# Patient Record
Sex: Female | Born: 1985
Health system: Southern US, Community
[De-identification: ages and names within clinical notes are randomized; demographics above are authoritative.]

## PROBLEM LIST (undated history)

## (undated) DIAGNOSIS — N200 Calculus of kidney: Secondary | ICD-10-CM

## (undated) DIAGNOSIS — R87810 Cervical high risk human papillomavirus (HPV) DNA test positive: Secondary | ICD-10-CM

## (undated) DIAGNOSIS — R87629 Unspecified abnormal cytological findings in specimens from vagina: Secondary | ICD-10-CM

## (undated) DIAGNOSIS — T7840XA Allergy, unspecified, initial encounter: Secondary | ICD-10-CM

## (undated) DIAGNOSIS — K8020 Calculus of gallbladder without cholecystitis without obstruction: Secondary | ICD-10-CM

## (undated) DIAGNOSIS — F419 Anxiety disorder, unspecified: Secondary | ICD-10-CM

## (undated) HISTORY — DX: Allergy, unspecified, initial encounter: T78.40XA

## (undated) HISTORY — DX: Calculus of kidney: N20.0

## (undated) HISTORY — DX: Unspecified abnormal cytological findings in specimens from vagina: R87.629

## (undated) HISTORY — DX: Cervical high risk human papillomavirus (HPV) DNA test positive: R87.810

## (undated) HISTORY — DX: Calculus of gallbladder without cholecystitis without obstruction: K80.20

## (undated) HISTORY — DX: Anxiety disorder, unspecified: F41.9

## (undated) HISTORY — PX: WISDOM TOOTH EXTRACTION: SHX21

---

## 2013-05-17 ENCOUNTER — Emergency Department: Payer: Self-pay | Admitting: Emergency Medicine

## 2013-05-17 LAB — CBC WITH DIFFERENTIAL/PLATELET
Basophil #: 0 10*3/uL (ref 0.0–0.1)
Basophil %: 0.6 %
Eosinophil #: 0.1 10*3/uL (ref 0.0–0.7)
Eosinophil %: 1.8 %
HCT: 41.9 % (ref 35.0–47.0)
HGB: 14.9 g/dL (ref 12.0–16.0)
Lymphocyte #: 2.4 10*3/uL (ref 1.0–3.6)
Lymphocyte %: 46.9 %
MCH: 32.6 pg (ref 26.0–34.0)
MCHC: 35.5 g/dL (ref 32.0–36.0)
MCV: 92 fL (ref 80–100)
Monocyte #: 0.4 x10 3/mm (ref 0.2–0.9)
Monocyte %: 7.2 %
Neutrophil #: 2.2 10*3/uL (ref 1.4–6.5)
Neutrophil %: 43.5 %
Platelet: 215 10*3/uL (ref 150–440)
RBC: 4.56 10*6/uL (ref 3.80–5.20)
RDW: 12.9 % (ref 11.5–14.5)
WBC: 5 10*3/uL (ref 3.6–11.0)

## 2013-05-17 LAB — URINALYSIS, COMPLETE
Bacteria: NONE SEEN
Bilirubin,UR: NEGATIVE
Glucose,UR: NEGATIVE mg/dL (ref 0–75)
Nitrite: NEGATIVE
Ph: 7 (ref 4.5–8.0)
Protein: 30
RBC,UR: 391 /HPF (ref 0–5)
Specific Gravity: 1.023 (ref 1.003–1.030)
Squamous Epithelial: 22
WBC UR: 33 /HPF (ref 0–5)

## 2013-05-17 LAB — BASIC METABOLIC PANEL
Anion Gap: 4 — ABNORMAL LOW (ref 7–16)
BUN: 9 mg/dL (ref 7–18)
Calcium, Total: 9.2 mg/dL (ref 8.5–10.1)
Chloride: 107 mmol/L (ref 98–107)
Co2: 27 mmol/L (ref 21–32)
Creatinine: 0.8 mg/dL (ref 0.60–1.30)
EGFR (African American): >60
EGFR (Non-African Amer.): >60
Glucose: 120 mg/dL — ABNORMAL HIGH (ref 65–99)
Osmolality: 276 (ref 275–301)
Potassium: 3.8 mmol/L (ref 3.5–5.1)
Sodium: 138 mmol/L (ref 136–145)

## 2013-05-19 ENCOUNTER — Ambulatory Visit: Payer: Self-pay | Admitting: Urology

## 2013-05-19 LAB — URINE CULTURE

## 2013-06-18 ENCOUNTER — Ambulatory Visit: Payer: Self-pay | Admitting: Urology

## 2015-12-27 LAB — HM PAP SMEAR: HM Pap smear: NEGATIVE

## 2017-01-07 ENCOUNTER — Encounter: Payer: Self-pay | Admitting: Obstetrics and Gynecology

## 2017-01-07 ENCOUNTER — Ambulatory Visit (INDEPENDENT_AMBULATORY_CARE_PROVIDER_SITE_OTHER): Payer: Self-pay | Admitting: Obstetrics and Gynecology

## 2017-01-07 VITALS — BP 98/60 | HR 61 | Ht 65.0 in | Wt 143.0 lb

## 2017-01-07 DIAGNOSIS — Z01419 Encounter for gynecological examination (general) (routine) without abnormal findings: Secondary | ICD-10-CM

## 2017-01-07 DIAGNOSIS — Z124 Encounter for screening for malignant neoplasm of cervix: Secondary | ICD-10-CM

## 2017-01-07 DIAGNOSIS — Z3042 Encounter for surveillance of injectable contraceptive: Secondary | ICD-10-CM

## 2017-01-07 MED ORDER — MEDROXYPROGESTERONE ACETATE 150 MG/ML IM SUSY: 150.0000 mg | PREFILLED_SYRINGE | INTRAMUSCULAR | 3 refills | Status: DC

## 2017-01-07 NOTE — Progress Notes (Signed)
HPI:      Kayla Townsend is a 31 y.o. No obstetric history on file. who LMP was No LMP recorded (lmp unknown). Patient has had an injection., presents today for her annual examination.  Her menses are absent due to depo.  Dysmenorrhea none. She does not have intermenstrual bleeding.  Sex activity: not sexually active.  Last Pap: December 27, 2015  Results were: no abnormalities  Hx of STDs: none   There is no FH of breast cancer. There is no FH of ovarian cancer. The patient does do self-breast exams.  Tobacco use: The patient denies current or previous tobacco use. Alcohol use: none Exercise: very active  She does get adequate calcium and Vitamin D in her diet.    Past Medical History:  Diagnosis Date  . Kidney stone     History reviewed. No pertinent surgical history.  Family History  Problem Relation Age of Onset  . Diabetes Maternal Grandfather   . Lung cancer Paternal Grandfather      ROS:  Review of Systems  Constitutional: Negative for fever, malaise/fatigue and weight loss.  HENT: Negative for congestion, ear pain and sinus pain.   Respiratory: Negative for cough, shortness of breath and wheezing.   Cardiovascular: Negative for chest pain, orthopnea and leg swelling.  Gastrointestinal: Negative for constipation, diarrhea, nausea and vomiting.  Genitourinary: Negative for dysuria, frequency, hematuria and urgency.       Breast ROS: negative   Musculoskeletal: Negative for back pain, joint pain and myalgias.  Skin: Negative for itching and rash.  Neurological: Negative for dizziness, tingling, focal weakness and headaches.  Endo/Heme/Allergies: Negative for environmental allergies. Does not bruise/bleed easily.  Psychiatric/Behavioral: Negative for depression and suicidal ideas. The patient is not nervous/anxious and does not have insomnia.     Objective: BP 98/60 (BP Location: Left Arm, Patient Position: Sitting, Cuff Size: Normal)   Pulse 61   Ht   (1.651 m)   Wt 143 lb (64.9 kg)   LMP  (LMP Unknown)   BMI 23.80 kg/m    Physical Exam  Constitutional: She is oriented to person, place, and time. She appears well-developed and well-nourished.  Genitourinary: Vagina normal and uterus normal. No erythema or tenderness in the vagina. No vaginal discharge found. Right adnexum does not display mass and does not display tenderness. Left adnexum does not display mass and does not display tenderness. Cervix does not exhibit motion tenderness or polyp. Uterus is not enlarged or tender.  Neck: Normal range of motion. No thyromegaly present.  Cardiovascular: Normal rate, regular rhythm and normal heart sounds.   No murmur heard. Pulmonary/Chest: Effort normal and breath sounds normal. Right breast exhibits no mass, no nipple discharge, no skin change and no tenderness. Left breast exhibits no mass, no nipple discharge, no skin change and no tenderness.  Abdominal: Soft. There is no tenderness. There is no guarding.  Musculoskeletal: Normal range of motion.  Neurological: She is alert and oriented to person, place, and time. No cranial nerve deficit.  Psychiatric: She has a normal mood and affect. Her behavior is normal.  Vitals reviewed.    Assessment/Plan: Encounter for annual routine gynecological examination  Cervical cancer screening - Plan: IGP, rfx Aptima HPV ASCU  Encounter for surveillance of injectable contraceptive - Doing well with depo. Cont calcium. May need to get 8/18 injection early since going to [place] for 3 months. - Plan: MedroxyPROGESTERone Acetate 150 MG/ML SUSY  GYN counsel use and side effects of depo, adequate intake of calcium and vitamin D     F/U  Return in about 1 year (around 01/07/2018).  Shaheed Schmuck B. Corabelle Spackman, PA-C 01/07/2017 2:37 PM

## 2017-01-08 LAB — IGP, RFX APTIMA HPV ASCU: PAP Smear Comment: 0

## 2017-01-24 ENCOUNTER — Ambulatory Visit: Payer: Self-pay

## 2017-01-31 ENCOUNTER — Ambulatory Visit: Payer: Self-pay

## 2017-01-31 ENCOUNTER — Ambulatory Visit (INDEPENDENT_AMBULATORY_CARE_PROVIDER_SITE_OTHER): Payer: Self-pay

## 2017-01-31 DIAGNOSIS — Z308 Encounter for other contraceptive management: Secondary | ICD-10-CM

## 2017-01-31 MED ORDER — MEDROXYPROGESTERONE ACETATE 150 MG/ML IM SUSP
150.0000 mg | Freq: Once | INTRAMUSCULAR | Status: AC
  Administered 2017-01-31: 150 mg via INTRAMUSCULAR

## 2017-01-31 NOTE — Progress Notes (Signed)
Pt here for depo which was given IM right glut.  NDC# [address]-2 

## 2017-05-03 ENCOUNTER — Ambulatory Visit: Payer: Self-pay

## 2017-05-03 ENCOUNTER — Ambulatory Visit (INDEPENDENT_AMBULATORY_CARE_PROVIDER_SITE_OTHER): Payer: Self-pay

## 2017-05-03 DIAGNOSIS — Z3042 Encounter for surveillance of injectable contraceptive: Secondary | ICD-10-CM

## 2017-05-03 MED ORDER — MEDROXYPROGESTERONE ACETATE 150 MG/ML IM SUSP
150.0000 mg | Freq: Once | INTRAMUSCULAR | Status: AC
  Administered 2017-05-03: 150 mg via INTRAMUSCULAR

## 2017-07-25 ENCOUNTER — Telehealth: Payer: Self-pay

## 2017-07-25 NOTE — Telephone Encounter (Signed)
PT called triage line stating she would like to discuss switching birthcontrol. CB# [phone]  LVM for patient to call me back so I could get more information to send to the provider.

## 2017-07-26 ENCOUNTER — Ambulatory Visit: Payer: Self-pay

## 2017-07-29 ENCOUNTER — Ambulatory Visit (INDEPENDENT_AMBULATORY_CARE_PROVIDER_SITE_OTHER): Payer: Self-pay | Admitting: Obstetrics and Gynecology

## 2017-07-29 ENCOUNTER — Encounter: Payer: Self-pay | Admitting: Obstetrics and Gynecology

## 2017-07-29 VITALS — BP 100/70 | Ht 65.0 in | Wt 149.0 lb

## 2017-07-29 DIAGNOSIS — Z30011 Encounter for initial prescription of contraceptive pills: Secondary | ICD-10-CM

## 2017-07-29 MED ORDER — NORGESTIMATE-ETH ESTRADIOL 0.25-35 MG-MCG PO TABS: 1.0000 | ORAL_TABLET | Freq: Every day | ORAL | 6 refills | Status: DC

## 2017-07-29 NOTE — Progress Notes (Signed)
   Chief Complaint  Patient presents with  . Contraception    HPI:      Kayla Townsend is a 31 y.o. G0P0000 who LMP was No LMP recorded. Patient has had an injection., presents today for ScnetxBC change. Pt has been on depo and wants to come off. She tried on her own but had irregular bleeding for a month and restarted depo. She would like to transition to OCPs first to see if will help with cycle control. Never been on them before. No hx of migraines with aura. Next depo due by mid Nov. She is not sex active.    Past Medical History:  Diagnosis Date  . Kidney stone     History reviewed. No pertinent surgical history.  Family History  Problem Relation Age of Onset  . Diabetes Maternal Grandfather   . Lung cancer Paternal Grandfather     Social History   Social History  . Marital status: Single    Spouse name: N/A  . Number of children: N/A  . Years of education: N/A   Occupational History  . Not on file.   Social History Main Topics  . Smoking status: Never Smoker  . Smokeless tobacco: Never Used  . Alcohol use No  . Drug use: No  . Sexual activity: Not Currently    Birth control/ protection: Injection   Other Topics Concern  . Not on file   Social History Narrative  . No narrative on file     Current Outpatient Prescriptions:  .  doxycycline (VIBRAMYCIN) 100 MG capsule, TK 1 C PO BID WF, Disp: , Rfl: 2 .  norgestimate-ethinyl estradiol (ORTHO-CYCLEN,SPRINTEC,PREVIFEM) 0.25-35 MG-MCG tablet, Take 1 tablet by mouth daily., Disp: 28 tablet, Rfl: 6   ROS:  Review of Systems  Constitutional: Negative for fever.  Gastrointestinal: Negative for blood in stool, constipation, diarrhea, nausea and vomiting.  Genitourinary: Negative for dyspareunia, dysuria, flank pain, frequency, hematuria, urgency, vaginal bleeding, vaginal discharge and vaginal pain.  Musculoskeletal: Negative for back pain.  Skin: Negative for rash.     OBJECTIVE:   Vitals:  BP 100/70    Ht 5\' 5"  (1.651 m)   Wt 149 lb (67.6 kg)   BMI 24.79 kg/m   Physical Exam  Constitutional: She is oriented to person, place, and time and well-developed, well-nourished, and in no distress.  Neurological: She is alert and oriented to person, place, and time.  Psychiatric: Memory, affect and judgment normal.  Vitals reviewed.   Assessment/Plan: Encounter for initial prescription of contraceptive pills - OCP start on Sun. Condoms for 1 mo prn. Rx sprintec since self-pay.  - Plan: norgestimate-ethinyl estradiol (ORTHO-CYCLEN,SPRINTEC,PREVIFEM) 0.25-35 MG-MCG tablet, DISCONTINUED: norgestimate-ethinyl estradiol (ORTHO-CYCLEN,SPRINTEC,PREVIFEM) 0.25-35 MG-MCG tablet    Return if symptoms worsen or fail to improve.  Alicia B. Copland, PA-C 07/29/2017 3:51 PM

## 2017-10-22 ENCOUNTER — Emergency Department: Payer: Self-pay

## 2017-10-22 ENCOUNTER — Emergency Department
Admission: EM | Admit: 2017-10-22 | Discharge: 2017-10-22 | Disposition: A | Discharge status: Home / Self Care | Payer: Self-pay | Attending: Emergency Medicine | Admitting: Emergency Medicine

## 2017-10-22 ENCOUNTER — Other Ambulatory Visit: Payer: Self-pay

## 2017-10-22 ENCOUNTER — Encounter: Payer: Self-pay | Admitting: Emergency Medicine

## 2017-10-22 DIAGNOSIS — R3129 Other microscopic hematuria: Secondary | ICD-10-CM | POA: Insufficient documentation

## 2017-10-22 DIAGNOSIS — R109 Unspecified abdominal pain: Secondary | ICD-10-CM

## 2017-10-22 DIAGNOSIS — N23 Unspecified renal colic: Secondary | ICD-10-CM | POA: Insufficient documentation

## 2017-10-22 DIAGNOSIS — Z87442 Personal history of urinary calculi: Secondary | ICD-10-CM | POA: Insufficient documentation

## 2017-10-22 LAB — CBC WITH DIFFERENTIAL/PLATELET
Basophils Absolute: 0 10*3/uL (ref 0–0.1)
Basophils Relative: 1 %
Eosinophils Absolute: 0.1 10*3/uL (ref 0–0.7)
Eosinophils Relative: 1 %
HCT: 42.1 % (ref 35.0–47.0)
Hemoglobin: 14.8 g/dL (ref 12.0–16.0)
Lymphocytes Relative: 20 %
Lymphs Abs: 1.3 10*3/uL (ref 1.0–3.6)
MCH: 32.7 pg (ref 26.0–34.0)
MCHC: 35.1 g/dL (ref 32.0–36.0)
MCV: 92.9 fL (ref 80.0–100.0)
Monocytes Absolute: 0.5 10*3/uL (ref 0.2–0.9)
Monocytes Relative: 7 %
Neutro Abs: 4.8 10*3/uL (ref 1.4–6.5)
Neutrophils Relative %: 71 %
Platelets: 214 10*3/uL (ref 150–440)
RBC: 4.53 MIL/uL (ref 3.80–5.20)
RDW: 13.7 % (ref 11.5–14.5)
WBC: 6.7 10*3/uL (ref 3.6–11.0)

## 2017-10-22 LAB — URINALYSIS, COMPLETE (UACMP) WITH MICROSCOPIC
Bacteria, UA: NONE SEEN
Bilirubin Urine: NEGATIVE
Glucose, UA: NEGATIVE mg/dL
Ketones, ur: 20 mg/dL — AB
Leukocytes, UA: NEGATIVE
Nitrite: NEGATIVE
Protein, ur: NEGATIVE mg/dL
Specific Gravity, Urine: 1.017 (ref 1.005–1.030)
pH: 7 (ref 5.0–8.0)

## 2017-10-22 LAB — BASIC METABOLIC PANEL
Anion gap: 11 (ref 5–15)
BUN: 16 mg/dL (ref 6–20)
CO2: 22 mmol/L (ref 22–32)
Calcium: 8.9 mg/dL (ref 8.9–10.3)
Chloride: 105 mmol/L (ref 101–111)
Creatinine, Ser: 0.93 mg/dL (ref 0.44–1.00)
GFR calc Af Amer: >60 mL/min (ref >60)
GFR calc non Af Amer: >60 mL/min (ref >60)
Glucose, Bld: 144 mg/dL — ABNORMAL HIGH (ref 65–99)
Potassium: 3.8 mmol/L (ref 3.5–5.1)
Sodium: 138 mmol/L (ref 135–145)

## 2017-10-22 LAB — HCG, QUANTITATIVE, PREGNANCY: hCG, Beta Chain, Quant, S: <1 m[IU]/mL (ref <5)

## 2017-10-22 MED ORDER — MORPHINE SULFATE (PF) 4 MG/ML IV SOLN
INTRAVENOUS | Status: AC
  Administered 2017-10-22: 4 mg via INTRAVENOUS
  Filled 2017-10-22: qty 1

## 2017-10-22 MED ORDER — ONDANSETRON HCL 4 MG/2ML IJ SOLN
4.0000 mg | Freq: Once | INTRAMUSCULAR | Status: AC
  Administered 2017-10-22: 4 mg via INTRAVENOUS

## 2017-10-22 MED ORDER — OXYCODONE-ACETAMINOPHEN 5-325 MG PO TABS
ORAL_TABLET | ORAL | Status: AC
  Administered 2017-10-22: 1 via ORAL
  Filled 2017-10-22: qty 1

## 2017-10-22 MED ORDER — NAPROXEN 500 MG PO TABS: 500.0000 mg | ORAL_TABLET | Freq: Two times a day (BID) | ORAL | 0 refills | Status: DC

## 2017-10-22 MED ORDER — ONDANSETRON 4 MG PO TBDP: 4.0000 mg | ORAL_TABLET | Freq: Three times a day (TID) | ORAL | 0 refills | Status: DC | PRN

## 2017-10-22 MED ORDER — OXYCODONE-ACETAMINOPHEN 5-325 MG PO TABS
1.0000 | ORAL_TABLET | Freq: Once | ORAL | Status: AC
  Administered 2017-10-22: 1 via ORAL

## 2017-10-22 MED ORDER — KETOROLAC TROMETHAMINE 60 MG/2ML IM SOLN
15.0000 mg | Freq: Once | INTRAMUSCULAR | Status: AC
  Administered 2017-10-22: 15 mg via INTRAMUSCULAR
  Filled 2017-10-22: qty 2

## 2017-10-22 MED ORDER — MORPHINE SULFATE (PF) 4 MG/ML IV SOLN
4.0000 mg | Freq: Once | INTRAVENOUS | Status: AC
  Administered 2017-10-22: 4 mg via INTRAVENOUS

## 2017-10-22 MED ORDER — ONDANSETRON HCL 4 MG/2ML IJ SOLN
INTRAMUSCULAR | Status: AC
  Administered 2017-10-22: 4 mg via INTRAVENOUS
  Filled 2017-10-22: qty 2

## 2017-10-22 MED ORDER — SODIUM CHLORIDE 0.9 % IV BOLUS (SEPSIS)
1000.0000 mL | Freq: Once | INTRAVENOUS | Status: AC
  Administered 2017-10-22: 1000 mL via INTRAVENOUS

## 2017-10-22 MED ORDER — OXYCODONE HCL 5 MG PO TABS: 5.0000 mg | ORAL_TABLET | Freq: Four times a day (QID) | ORAL | 0 refills | Status: DC | PRN

## 2017-10-22 NOTE — ED Notes (Signed)
First Nurse Note:  Complaining of right flank pain and nausea.  Skin damp to touch.  Seated in WC.

## 2017-10-22 NOTE — ED Provider Notes (Signed)
[facility] Emergency Department Provider Note  ____________________________________________  Time seen: Approximately 11:43 AM  I have reviewed the triage vital signs and the nursing notes.   HISTORY  Chief Complaint Flank Pain    HPI Kayla Townsend is a 32 y.o. female who complains of sudden onset right flank pain radiating around to the right lower quadrant starting at 8 AM today. Feels like prior kidney stones. It is constant, waxing and waning, severe, no aggravating or alleviating factors. Associated with nausea and vomiting. No fevers chills or sweats.     Past Medical History:  Diagnosis Date  . Kidney stone      There are no active problems to display for this patient.    History reviewed. No pertinent surgical history.   Prior to Admission medications   Medication Sig Start Date End Date Taking? Authorizing Provider  norgestimate-ethinyl estradiol (ORTHO-CYCLEN,SPRINTEC,PREVIFEM) 0.25-35 MG-MCG tablet Take 1 tablet by mouth daily. 07/29/17  Yes Copland, Helmut MusterAlicia B, PA-C  naproxen (NAPROSYN) 500 MG tablet Take 1 tablet (500 mg total) by mouth 2 (two) times daily with a meal. 10/22/17   Sharman CheekStafford, Adreana Coull, MD  ondansetron (ZOFRAN ODT) 4 MG disintegrating tablet Take 1 tablet (4 mg total) by mouth every 8 (eight) hours as needed for nausea or vomiting. 10/22/17   Sharman CheekStafford, Nixon Sparr, MD  oxyCODONE (ROXICODONE) 5 MG immediate release tablet Take 1 tablet (5 mg total) by mouth every 6 (six) hours as needed for breakthrough pain. 10/22/17   Sharman CheekStafford, Janai Maudlin, MD     Allergies Ceclor [cefaclor]   Family History  Problem Relation Age of Onset  . Diabetes Maternal Grandfather   . Lung cancer Paternal Grandfather     Social History Social History   Tobacco Use  . Smoking status: Never Smoker  . Smokeless tobacco: Never Used  Substance Use Topics  . Alcohol use: No  . Drug use: No    Review of Systems  Constitutional:   No fever or  chills.  Cardiovascular:   No chest pain or syncope. Respiratory:   No dyspnea or cough. Gastrointestinal:  Positive right flank pain and vomiting. No constipation Musculoskeletal:   Negative for focal pain or swelling All other systems reviewed and are negative except as documented above in ROS and HPI.  ____________________________________________   PHYSICAL EXAM:  VITAL SIGNS: ED Triage Vitals  Enc Vitals Group     BP 10/22/17 0905 (!) 93/59     Pulse Rate 10/22/17 0905 (!) 50     Resp 10/22/17 0905 (!) 25     Temp 10/22/17 0905 98.6 F (37 C)     Temp Source 10/22/17 0905 Oral     SpO2 10/22/17 0905 99 %     Weight 10/22/17 0910 149 lb (67.6 kg)     Height --      Head Circumference --      Peak Flow --      Pain Score 10/22/17 0910 10     Pain Loc --      Pain Edu? --      Excl. in GC? --     Vital signs reviewed, nursing assessments reviewed.   Constitutional:   Alert and oriented. Very uncomfortable but not in distress  ENT   Head:   Normocephalic and atraumatic.        Neck:   No meningismus. Full ROM. Hematological/Lymphatic/Immunilogical:   No cervical lymphadenopathy. Cardiovascular:   RRR. Symmetric bilateral radial and DP pulses.  No murmurs.  Respiratory:   Normal respiratory effort without tachypnea/retractions. Breath sounds are clear and equal bilaterally. No wheezes/rales/rhonchi. Gastrointestinal:   Soft and nontender. Non distended. There is mild right CVA tenderness.  No rebound, rigidity, or guarding. Genitourinary:   deferred Musculoskeletal:   Normal range of motion in all extremities. No joint effusions.  No lower extremity tenderness.  No edema. Neurologic:   Normal speech and language.  Motor grossly intact. No acute focal neurologic deficits are appreciated.    ____________________________________________    LABS (pertinent positives/negatives) (all labs ordered are listed, but only abnormal results are displayed) Labs  Reviewed  BASIC METABOLIC PANEL - Abnormal; Notable for the following components:      Result Value   Glucose, Bld 144 (*)    All other components within normal limits  URINALYSIS, COMPLETE (UACMP) WITH MICROSCOPIC - Abnormal; Notable for the following components:   Color, Urine YELLOW (*)    APPearance HAZY (*)    Hgb urine dipstick SMALL (*)    Ketones, ur 20 (*)    Squamous Epithelial / LPF 0-5 (*)    All other components within normal limits  CBC WITH DIFFERENTIAL/PLATELET  HCG, QUANTITATIVE, PREGNANCY   ____________________________________________   EKG    ____________________________________________    RADIOLOGY  Ct Renal Stone Study  Result Date: 10/22/2017 CLINICAL DATA:  Right flank pain radiating to the abdomen, history of kidney stones, nausea and vomiting EXAM: CT ABDOMEN AND PELVIS WITHOUT CONTRAST TECHNIQUE: Multidetector CT imaging of the abdomen and pelvis was performed following the standard protocol without IV contrast. COMPARISON:  KUB of 06/18/2013 and CT abdomen pelvis of 05/17/2013 FINDINGS: Lower chest: The lung bases are clear. The heart is within normal limits in size. No pericardial effusion is seen. Hepatobiliary: The liver is unremarkable in the unenhanced state. No calcified gallstones are seen. Pancreas: The pancreas is not well seen on this unenhanced study but no significant abnormality is evident. Spleen: The spleen is stable in size without focal abnormality. Adrenals/Urinary Tract: The adrenal glands appear normal. There are multiple small renal calculi bilaterally. No left hydronephrosis is seen. A probable tiny cyst emanates from the lower pole of the left kidney. However there is mild right hydronephrosis and minimal dilatation of the of the right ureter two point of low-grade obstruction by a 3 mm distal right ureteral calculus, within 1 or 2 cm of the right UV junction. The left ureter is normal in caliber. The urinary bladder is not well distended  but no abnormality is noted. Stomach/Bowel: The stomach is decompressed and cannot be well evaluated. No small bowel distention is seen. There is a moderate amount of feces throughout the entire colon. No bowel obstruction is noted. The terminal ileum and the appendix are unremarkable. Vascular/Lymphatic: The abdominal aorta is normal in caliber. No adenopathy is seen. Reproductive: The uterus is normal in size. No adnexal lesion is seen. No fluid is noted within the pelvis. Other: None. Musculoskeletal: The lumbar vertebrae are in normal alignment with normal intervertebral disc spaces. The SI joints appear well corticated. IMPRESSION: 1. Low-grade obstruction of the right renal collecting system and ureter by a 3 mm distal left ureteral calculus very near the right UV junction. 2. Multiple small bilateral renal calculi. Electronically Signed   By: Dwyane Dee M.D.   On: 10/22/2017 11:27    ____________________________________________   PROCEDURES Procedures  ____________________________________________    CLINICAL IMPRESSION / ASSESSMENT AND PLAN / ED COURSE  Pertinent labs & imaging results that were available  during my care of the patient were reviewed by me and considered in my medical decision making (see chart for details).     Clinical Course as of Oct 22 1141  Tue Oct 22, 2017  6045 Patient in severe pain, likely renal colic. We'll get a CT scan all checking labs urinalysis. Patient received Toradol and Zofran. With pain control is not adequate within a few minutes we will give opioids as well. IV fluids.  [PS]  1025 Pain relief inadequate with toradol. Morphine given.   [PS]  1133 Ct reveals 3mm stone near R UVJ. Highly likely to pass with substantial reduction in symptoms soon. Will give po percocet, plan for outpatient f/u.  CT Renal Soundra Pilon [PS]    Clinical Course User Index [PS] Sharman Cheek, MD   Considering the patient's symptoms, medical history, and physical  examination today, I have low suspicion for cholecystitis or biliary pathology, pancreatitis, perforation or bowel obstruction, hernia, intra-abdominal abscess, AAA or dissection, volvulus or intussusception, mesenteric ischemia, or appendicitis.     ____________________________________________   FINAL CLINICAL IMPRESSION(S) / ED DIAGNOSES    Final diagnoses:  Right flank pain  Ureteral colic  Other microscopic hematuria       Portions of this note were generated with dragon dictation software. Dictation errors may occur despite best attempts at proofreading.    Sharman Cheek, MD 10/22/17 1146

## 2017-10-22 NOTE — Discharge Instructions (Signed)
Your CT scan shows a 3mm stone near the bladder on the right.  It should pass into the bladder soon, at which point your symptoms will dramatically improve.

## 2017-10-22 NOTE — ED Triage Notes (Signed)
Pt to ed with c/o right flank pain radiates to abd, +n/v. Hx of kidney stones.

## 2017-10-24 ENCOUNTER — Other Ambulatory Visit: Payer: Self-pay | Admitting: Obstetrics & Gynecology

## 2017-10-24 ENCOUNTER — Telehealth: Payer: Self-pay

## 2017-10-24 NOTE — Telephone Encounter (Signed)
Pt went to walk in clinic but they would not give rx - they don't give pain med.  Adv her to f/u c [facility].  Maudry MayhewAdv Sandy that [facility] doesn't do primary care.  Pt needs PCP which she doesn't have nor does she have ins.  Adv may need to go back to ED which mom is trying to avoid d/t cost.  Mom is asking if you would be willing to give pain med.  Adv I would let you know but I couldn't make promises.

## 2017-10-24 NOTE — Telephone Encounter (Signed)
Pt's mom, Andrey CampanileSandy, calling for pt.  Pt has kidney stones, was seen in ED, given pain med - 6 pills, had reoccurance last night.  She was instructed to f/u c walkin clinic today.  Should she do that, see you, or can you call in more pain meds.  Evidently the stone hasn't passed and pt is anxious b/c she took last pain pill this am.  [phone]

## 2017-10-24 NOTE — Telephone Encounter (Signed)
Yes, she should see walk in clinic for mgmt. Thx.

## 2017-10-24 NOTE — Telephone Encounter (Signed)
Her pain meds have to have printed and signed Rx. I'm not in office till Mon. Ask RPH his thoughts since he is there today. I agree with whatever he says.

## 2017-10-24 NOTE — Telephone Encounter (Signed)
We havent seen this patient for this condition so not able to legally prescribe; would need to be seen

## 2017-10-28 NOTE — Telephone Encounter (Signed)
Left detailed msg.

## 2018-01-19 ENCOUNTER — Other Ambulatory Visit: Payer: Self-pay | Admitting: Obstetrics and Gynecology

## 2018-01-19 DIAGNOSIS — Z30011 Encounter for initial prescription of contraceptive pills: Secondary | ICD-10-CM

## 2018-01-31 ENCOUNTER — Telehealth: Payer: Self-pay

## 2018-01-31 NOTE — Telephone Encounter (Signed)
Spoke w/pt. Discussed that headaches can be a side effect of OCP's in some cases. Notified [facility] out of the office today & will return on Monday. [facility] can discuss with her other possible options. Pt not due to start new pack for 2 wks. She just wanted to discuss prior to picking up next pack.

## 2018-01-31 NOTE — Telephone Encounter (Signed)
Pt contacted after hours on call service this a.m. To report she began new birth control late October and has been getting headaches a couple of times per week since then. Inquiring if this is a side effect. Pt sees [facility]. Cb#[phone]

## 2018-02-04 NOTE — Telephone Encounter (Signed)
LMTRC

## 2018-02-05 NOTE — Telephone Encounter (Signed)
Pt called triage today and wants to discuss switching her birth control. Requests call from [facility]. Cb# [phone] thank you

## 2018-02-06 NOTE — Telephone Encounter (Signed)
LM. Can try different OCP vs different BC option. Also due for annual. Pt to call me back with pref.

## 2018-02-07 ENCOUNTER — Other Ambulatory Visit: Payer: Self-pay | Admitting: Obstetrics and Gynecology

## 2018-02-07 MED ORDER — LEVONORGESTREL-ETHINYL ESTRAD 0.1-20 MG-MCG PO TABS: 1.0000 | ORAL_TABLET | Freq: Every day | ORAL | 1 refills | Status: DC

## 2018-02-07 NOTE — Progress Notes (Signed)
Rx change due to HAs on sprintec. Annual due

## 2018-02-07 NOTE — Telephone Encounter (Signed)
Pt aware RX at pharm. Please schedule annual appt for pt. She is aware you will call her

## 2018-02-07 NOTE — Telephone Encounter (Signed)
Pt called after hours triage line. States she wants a different OCP, the cheapest one.

## 2018-02-07 NOTE — Telephone Encounter (Signed)
Patient is schedule 03/13/18 with [facility]

## 2018-02-07 NOTE — Telephone Encounter (Signed)
RN to let pt know different OCP eRxd to [facility]. Pt needs to start when due to start new pill pack. She was on cheapest without insurance. She will have to check with pharmacy about prices since self-pay. Pt needs to sched annual, too. Thx.

## 2018-03-03 DIAGNOSIS — L700 Acne vulgaris: Secondary | ICD-10-CM | POA: Diagnosis not present

## 2018-03-13 ENCOUNTER — Ambulatory Visit: Payer: Self-pay | Admitting: Obstetrics and Gynecology

## 2018-03-31 DIAGNOSIS — K8020 Calculus of gallbladder without cholecystitis without obstruction: Secondary | ICD-10-CM

## 2018-03-31 DIAGNOSIS — R87810 Cervical high risk human papillomavirus (HPV) DNA test positive: Secondary | ICD-10-CM

## 2018-03-31 HISTORY — DX: Cervical high risk human papillomavirus (HPV) DNA test positive: R87.810

## 2018-03-31 HISTORY — DX: Calculus of gallbladder without cholecystitis without obstruction: K80.20

## 2018-04-09 ENCOUNTER — Encounter: Payer: Self-pay | Admitting: Obstetrics and Gynecology

## 2018-04-09 ENCOUNTER — Other Ambulatory Visit: Payer: Self-pay

## 2018-04-09 ENCOUNTER — Emergency Department: Payer: BLUE CROSS/BLUE SHIELD

## 2018-04-09 ENCOUNTER — Encounter: Payer: Self-pay | Admitting: Emergency Medicine

## 2018-04-09 ENCOUNTER — Other Ambulatory Visit (HOSPITAL_COMMUNITY)
Admission: RE | Admit: 2018-04-09 | Discharge: 2018-04-09 | Disposition: A | Discharge status: Home / Self Care | Payer: BLUE CROSS/BLUE SHIELD | Source: Ambulatory Visit | Attending: Obstetrics and Gynecology | Admitting: Obstetrics and Gynecology

## 2018-04-09 ENCOUNTER — Ambulatory Visit (INDEPENDENT_AMBULATORY_CARE_PROVIDER_SITE_OTHER): Payer: BLUE CROSS/BLUE SHIELD | Admitting: Obstetrics and Gynecology

## 2018-04-09 ENCOUNTER — Emergency Department
Admission: EM | Admit: 2018-04-09 | Discharge: 2018-04-09 | Disposition: A | Discharge status: Home / Self Care | Payer: BLUE CROSS/BLUE SHIELD | Attending: Student in an Organized Health Care Education/Training Program | Admitting: Student in an Organized Health Care Education/Training Program

## 2018-04-09 VITALS — BP 112/68 | HR 81 | Ht 65.0 in | Wt 154.0 lb

## 2018-04-09 DIAGNOSIS — N132 Hydronephrosis with renal and ureteral calculous obstruction: Secondary | ICD-10-CM | POA: Diagnosis not present

## 2018-04-09 DIAGNOSIS — Z124 Encounter for screening for malignant neoplasm of cervix: Secondary | ICD-10-CM | POA: Diagnosis not present

## 2018-04-09 DIAGNOSIS — Z01411 Encounter for gynecological examination (general) (routine) with abnormal findings: Secondary | ICD-10-CM | POA: Diagnosis not present

## 2018-04-09 DIAGNOSIS — Z3041 Encounter for surveillance of contraceptive pills: Secondary | ICD-10-CM | POA: Diagnosis not present

## 2018-04-09 DIAGNOSIS — Z79899 Other long term (current) drug therapy: Secondary | ICD-10-CM | POA: Diagnosis not present

## 2018-04-09 DIAGNOSIS — N200 Calculus of kidney: Secondary | ICD-10-CM | POA: Diagnosis not present

## 2018-04-09 DIAGNOSIS — R109 Unspecified abdominal pain: Secondary | ICD-10-CM | POA: Diagnosis not present

## 2018-04-09 DIAGNOSIS — R51 Headache: Secondary | ICD-10-CM

## 2018-04-09 DIAGNOSIS — Z1151 Encounter for screening for human papillomavirus (HPV): Secondary | ICD-10-CM

## 2018-04-09 DIAGNOSIS — R1031 Right lower quadrant pain: Secondary | ICD-10-CM | POA: Diagnosis not present

## 2018-04-09 DIAGNOSIS — M545 Low back pain: Secondary | ICD-10-CM | POA: Diagnosis not present

## 2018-04-09 DIAGNOSIS — Z01419 Encounter for gynecological examination (general) (routine) without abnormal findings: Secondary | ICD-10-CM

## 2018-04-09 LAB — CBC WITH DIFFERENTIAL/PLATELET
Basophils Absolute: 0 10*3/uL (ref 0–0.1)
Basophils Relative: 0 %
Eosinophils Absolute: 0 10*3/uL (ref 0–0.7)
Eosinophils Relative: 0 %
HCT: 42.3 % (ref 35.0–47.0)
Hemoglobin: 14.9 g/dL (ref 12.0–16.0)
Lymphocytes Relative: 23 %
Lymphs Abs: 1.2 10*3/uL (ref 1.0–3.6)
MCH: 32.5 pg (ref 26.0–34.0)
MCHC: 35.3 g/dL (ref 32.0–36.0)
MCV: 92.2 fL (ref 80.0–100.0)
Monocytes Absolute: 0.3 10*3/uL (ref 0.2–0.9)
Monocytes Relative: 6 %
Neutro Abs: 3.7 10*3/uL (ref 1.4–6.5)
Neutrophils Relative %: 71 %
Platelets: 202 10*3/uL (ref 150–440)
RBC: 4.58 MIL/uL (ref 3.80–5.20)
RDW: 13.3 % (ref 11.5–14.5)
WBC: 5.2 10*3/uL (ref 3.6–11.0)

## 2018-04-09 LAB — COMPREHENSIVE METABOLIC PANEL
ALT: 21 U/L (ref 0–44)
AST: 31 U/L (ref 15–41)
Albumin: 4.2 g/dL (ref 3.5–5.0)
Alkaline Phosphatase: 56 U/L (ref 38–126)
Anion gap: 7 (ref 5–15)
BUN: 16 mg/dL (ref 6–20)
CO2: 24 mmol/L (ref 22–32)
Calcium: 9.3 mg/dL (ref 8.9–10.3)
Chloride: 109 mmol/L (ref 98–111)
Creatinine, Ser: 0.73 mg/dL (ref 0.44–1.00)
GFR calc Af Amer: >60 mL/min (ref >60)
GFR calc non Af Amer: >60 mL/min (ref >60)
Glucose, Bld: 112 mg/dL — ABNORMAL HIGH (ref 70–99)
Potassium: 3.9 mmol/L (ref 3.5–5.1)
Sodium: 140 mmol/L (ref 135–145)
Total Bilirubin: 0.7 mg/dL (ref 0.3–1.2)
Total Protein: 7.8 g/dL (ref 6.5–8.1)

## 2018-04-09 LAB — URINALYSIS, COMPLETE (UACMP) WITH MICROSCOPIC
Bacteria, UA: NONE SEEN
Bilirubin Urine: NEGATIVE
Glucose, UA: NEGATIVE mg/dL
Ketones, ur: 20 mg/dL — AB
Nitrite: NEGATIVE
Protein, ur: 30 mg/dL — AB
RBC / HPF: >50 RBC/hpf — ABNORMAL HIGH (ref 0–5)
Specific Gravity, Urine: 1.026 (ref 1.005–1.030)
pH: 5 (ref 5.0–8.0)

## 2018-04-09 LAB — POCT PREGNANCY, URINE: Preg Test, Ur: NEGATIVE

## 2018-04-09 MED ORDER — SODIUM CHLORIDE 0.9 % IV BOLUS
1000.0000 mL | Freq: Once | INTRAVENOUS | Status: AC
  Administered 2018-04-09: 1000 mL via INTRAVENOUS

## 2018-04-09 MED ORDER — HYDROCODONE-ACETAMINOPHEN 5-325 MG PO TABS: 1.0000 | ORAL_TABLET | ORAL | 0 refills | Status: DC | PRN

## 2018-04-09 MED ORDER — KETOROLAC TROMETHAMINE 30 MG/ML IJ SOLN
15.0000 mg | Freq: Once | INTRAMUSCULAR | Status: AC
  Administered 2018-04-09: 15 mg via INTRAVENOUS
  Filled 2018-04-09: qty 1

## 2018-04-09 MED ORDER — NORETHINDRONE 0.35 MG PO TABS: 1.0000 | ORAL_TABLET | Freq: Every day | ORAL | 3 refills | Status: DC

## 2018-04-09 MED ORDER — TAMSULOSIN HCL 0.4 MG PO CAPS: 0.4000 mg | ORAL_CAPSULE | Freq: Every day | ORAL | 0 refills | Status: DC

## 2018-04-09 MED ORDER — PROMETHAZINE HCL 25 MG/ML IJ SOLN
12.5000 mg | Freq: Four times a day (QID) | INTRAMUSCULAR | Status: DC | PRN
  Administered 2018-04-09: 12.5 mg via INTRAVENOUS
  Filled 2018-04-09: qty 1

## 2018-04-09 MED ORDER — MORPHINE SULFATE (PF) 4 MG/ML IV SOLN
4.0000 mg | INTRAVENOUS | Status: DC | PRN
  Administered 2018-04-09: 4 mg via INTRAVENOUS
  Filled 2018-04-09: qty 1

## 2018-04-09 MED ORDER — PROCHLORPERAZINE MALEATE 10 MG PO TABS: 10.0000 mg | ORAL_TABLET | Freq: Four times a day (QID) | ORAL | 0 refills | Status: DC | PRN

## 2018-04-09 MED ORDER — KETOROLAC TROMETHAMINE 30 MG/ML IJ SOLN: 15.0000 mg | Freq: Once | INTRAMUSCULAR | Status: DC

## 2018-04-09 NOTE — Patient Instructions (Signed)
I value your feedback and entrusting us with your care. If you get a [facility] patient survey, I would appreciate you taking the time to let us know about your experience today. Thank you! 

## 2018-04-09 NOTE — ED Provider Notes (Signed)
Renal US findings consistent with presumed ureteral stone on right side. Will plan on discharging with paperwork prepared by Dr. Roxan Hockeyobinson.   Phineas SemenGoodman, Casia Corti, MD 04/09/18 [phone]

## 2018-04-09 NOTE — Progress Notes (Signed)
Chief Complaint  Patient presents with  . Gynecologic Exam    Hospital this am for kidney stones      HPI:      Kayla Townsend is a 32 y.o. G0P0000 who LMP was Patient's last menstrual period was 04/04/2018 (approximate)., presents today for her annual examination. Her menses are monthly with OCPs, last 5-10 days. Dysmenorrhea none. She does not have intermenstrual bleeding. Changed from depo to OCPs 10/18 in order to get off depo. Had headaches with sprintec, so changed to aviane. Still having headaches, almost weekly. No headaches with depo. Didn't like IUD in the past.  Sex activity: sexually active--using OCPs.  Last Pap: 01/07/17  Results were: no abnormalities  Hx of STDs: none  There is no FH of breast cancer. There is no FH of ovarian cancer. The patient does do self-breast exams.  Tobacco use: The patient denies current or previous tobacco use. Alcohol use: none Exercise: very active  She does get adequate calcium and Vitamin D in her diet.  Went to ED today for back/flank pain. Diagnosed with kidney stones, mod hydronephrosis, and incidentally, cholelithiasis. Pt without gallbladder sx. Hx of kidney stones in past.  Past Medical History:  Diagnosis Date  . Cholelithiases 03/2018   on renal u/s, no sx  . Kidney stone     History reviewed. No pertinent surgical history.  Family History  Problem Relation Age of Onset  . Diabetes Maternal Grandfather   . Lung cancer Paternal Grandfather   . Breast cancer Neg Hx   . Ovarian cancer Neg Hx     Social History   Socioeconomic History  . Marital status: Single    Spouse name: Not on file  . Number of children: Not on file  . Years of education: Not on file  . Highest education level: Not on file  Occupational History  . Not on file  Social Needs  . Financial resource strain: Not on file  . Food insecurity:    Worry: Not on file    Inability: Not on file  . Transportation needs:    Medical: Not  on file    Non-medical: Not on file  Tobacco Use  . Smoking status: Never Smoker  . Smokeless tobacco: Never Used  Substance and Sexual Activity  . Alcohol use: No  . Drug use: No  . Sexual activity: Yes    Birth control/protection: Pill  Lifestyle  . Physical activity:    Days per week: Not on file    Minutes per session: Not on file  . Stress: Not on file  Relationships  . Social connections:    Talks on phone: Not on file    Gets together: Not on file    Attends religious service: Not on file    Active member of club or organization: Not on file    Attends meetings of clubs or organizations: Not on file    Relationship status: Not on file  . Intimate partner violence:    Fear of current or ex partner: Not on file    Emotionally abused: Not on file    Physically abused: Not on file    Forced sexual activity: Not on file  Other Topics Concern  . Not on file  Social History Narrative  . Not on file    Current Outpatient Medications on File Prior to Visit  Medication Sig Dispense Refill  . clindamycin (CLEOCIN T) 1 % lotion APP A THIN FILM TO  FACE EVERY MORNING  4  . naproxen (NAPROSYN) 500 MG tablet Take 1 tablet (500 mg total) by mouth 2 (two) times daily with a meal. (Patient not taking: Reported on 04/09/2018) 20 tablet 0  . ondansetron (ZOFRAN ODT) 4 MG disintegrating tablet Take 1 tablet (4 mg total) by mouth every 8 (eight) hours as needed for nausea or vomiting. (Patient not taking: Reported on 04/09/2018) 20 tablet 0  . oxyCODONE (ROXICODONE) 5 MG immediate release tablet Take 1 tablet (5 mg total) by mouth every 6 (six) hours as needed for breakthrough pain. (Patient not taking: Reported on 04/09/2018) 6 tablet 0   No current facility-administered medications on file prior to visit.       ROS:  Review of Systems  Constitutional: Negative for fatigue, fever and unexpected weight change.  Respiratory: Negative for cough, shortness of breath and wheezing.     Cardiovascular: Negative for chest pain, palpitations and leg swelling.  Gastrointestinal: Negative for blood in stool, constipation, diarrhea, nausea and vomiting.  Endocrine: Negative for cold intolerance, heat intolerance and polyuria.  Genitourinary: Positive for flank pain and frequency. Negative for dyspareunia, dysuria, genital sores, hematuria, menstrual problem, pelvic pain, urgency, vaginal bleeding, vaginal discharge and vaginal pain.  Musculoskeletal: Negative for back pain, joint swelling and myalgias.  Skin: Negative for rash.  Neurological: Negative for dizziness, syncope, light-headedness, numbness and headaches.  Hematological: Negative for adenopathy.  Psychiatric/Behavioral: Negative for agitation, confusion, sleep disturbance and suicidal ideas. The patient is not nervous/anxious.      Objective: BP 112/68   Pulse 81   Ht 5\' 5"  (1.651 m)   Wt 154 lb (69.9 kg)   LMP 04/04/2018 (Approximate)   BMI 25.63 kg/m    Physical Exam  Constitutional: She is oriented to person, place, and time. She appears well-developed and well-nourished.  Genitourinary: Vagina normal and uterus normal. There is no rash or tenderness on the right labia. There is no rash or tenderness on the left labia. No erythema or tenderness in the vagina. No vaginal discharge found. Right adnexum does not display mass and does not display tenderness. Left adnexum does not display mass and does not display tenderness. Cervix does not exhibit motion tenderness or polyp. Uterus is not enlarged or tender.  Neck: Normal range of motion. No thyromegaly present.  Cardiovascular: Normal rate, regular rhythm and normal heart sounds.  No murmur heard. Pulmonary/Chest: Effort normal and breath sounds normal. Right breast exhibits no mass, no nipple discharge, no skin change and no tenderness. Left breast exhibits no mass, no nipple discharge, no skin change and no tenderness.  Abdominal: Soft. There is no  tenderness. There is no guarding.  Musculoskeletal: Normal range of motion.  Neurological: She is alert and oriented to person, place, and time. No cranial nerve deficit.  Psychiatric: She has a normal mood and affect. Her behavior is normal.  Vitals reviewed.   Assessment/Plan: Encounter for annual routine gynecological examination  Cervical cancer screening - Plan: Cytology - PAP  Screening for HPV (human papillomavirus) - Plan: Cytology - PAP  Encounter for surveillance of contraceptive pills - OCP change to POP due to headaches with estrogen OCPs. Rx camila. Pt doesn't want depo again, didn't like IUD, discussed nexplanon. F/u prn.  - Plan: norethindrone (MICRONOR,CAMILA,ERRIN) 0.35 MG tablet  Meds ordered this encounter  Medications  . norethindrone (MICRONOR,CAMILA,ERRIN) 0.35 MG tablet    Sig: Take 1 tablet (0.35 mg total) by mouth daily.    Dispense:  84 tablet  Refill:  3    Order Specific Question:   Supervising Provider    Answer:   Nadara Mustard [[id number]]             GYN counsel family planning choices, adequate intake of calcium and vitamin D, diet and exercise     F/U  Return in about 1 year (around 04/10/2019).  Kayla Buttrey B. Teo Moede, PA-C 04/09/2018 5:02 PM

## 2018-04-09 NOTE — ED Triage Notes (Signed)
Patient ambulatory to triage with steady gait, without difficulty, appears uncomfortable; pt reports since last night having right flank pain radiating into bladder accomp by N/V

## 2018-04-09 NOTE — ED Provider Notes (Signed)
[facility] Emergency Department Provider Note    First MD Initiated Contact with Patient 04/09/18 0501     (approximate)  I have reviewed the triage vital signs and the nursing notes.   HISTORY  Chief Complaint Flank Pain    HPI Seba E Clint Guy is a 32 y.o. female kidney stones presents with chief complaint of right low back pain and flank pain that started around 8 PM after patient was getting on with exercising.  Denies any fevers.  Is having nausea and vomiting associated with it.  States is not quite as severe as previous kidney stones.  States she was having some bladder discomfort over the past week and did take an Azo but denies any fevers.    Past Medical History:  Diagnosis Date  . Kidney stone    Family History  Problem Relation Age of Onset  . Diabetes Maternal Grandfather   . Lung cancer Paternal Grandfather    History reviewed. No pertinent surgical history. There are no active problems to display for this patient.     Prior to Admission medications   Medication Sig Start Date End Date Taking? Authorizing Provider  HYDROcodone-acetaminophen (NORCO) 5-325 MG tablet Take 1 tablet by mouth every 4 (four) hours as needed for moderate pain. 04/09/18   Willy Eddy, MD  levonorgestrel-ethinyl estradiol Janann Colonel) 0.1-20 MG-MCG tablet Take 1 tablet by mouth daily. 02/07/18   Copland, Ilona Sorrel, PA-C  naproxen (NAPROSYN) 500 MG tablet Take 1 tablet (500 mg total) by mouth 2 (two) times daily with a meal. 10/22/17   Sharman Cheek, MD  ondansetron (ZOFRAN ODT) 4 MG disintegrating tablet Take 1 tablet (4 mg total) by mouth every 8 (eight) hours as needed for nausea or vomiting. 10/22/17   Sharman Cheek, MD  oxyCODONE (ROXICODONE) 5 MG immediate release tablet Take 1 tablet (5 mg total) by mouth every 6 (six) hours as needed for breakthrough pain. 10/22/17   Sharman Cheek, MD  prochlorperazine (COMPAZINE) 10 MG tablet Take 1 tablet (10 mg  total) by mouth every 6 (six) hours as needed for nausea or vomiting. 04/09/18   Willy Eddy, MD  SPRINTEC 28 0.25-35 MG-MCG tablet TAKE 1 TABLET BY MOUTH ONCE DAILY 01/20/18   Copland, Ilona Sorrel, PA-C  tamsulosin (FLOMAX) 0.4 MG CAPS capsule Take 1 capsule (0.4 mg total) by mouth daily after supper. 04/09/18   Willy Eddy, MD    Allergies Ceclor [cefaclor]    Social History Social History   Tobacco Use  . Smoking status: Never Smoker  . Smokeless tobacco: Never Used  Substance Use Topics  . Alcohol use: No  . Drug use: No    Review of Systems Patient denies headaches, rhinorrhea, blurry vision, numbness, shortness of breath, chest pain, edema, cough, abdominal pain, nausea, vomiting, diarrhea, dysuria, fevers, rashes or hallucinations unless otherwise stated above in HPI. ____________________________________________   PHYSICAL EXAM:  VITAL SIGNS: Vitals:   04/09/18 0459  BP: (!) 120/93  Pulse: 69  Resp: 20  Temp: 98.2 F (36.8 C)  SpO2: 97%    Constitutional: Alert and oriented. Unable to sit still in room  Eyes: Conjunctivae are normal.  Head: Atraumatic. Nose: No congestion/rhinnorhea. Mouth/Throat: Mucous membranes are moist.   Neck: No stridor. Painless ROM.  Cardiovascular: Normal rate, regular rhythm. Grossly normal heart sounds.  Good peripheral circulation. Respiratory: Normal respiratory effort.  No retractions. Lungs CTAB. Gastrointestinal: Soft and nontender. No distention. No abdominal bruits. + right CVA tenderness. Genitourinary: deferred Musculoskeletal: No  lower extremity tenderness nor edema.  No joint effusions. Neurologic:  Normal speech and language. No gross focal neurologic deficits are appreciated. No facial droop Skin:  Skin is warm, dry and intact. No rash noted. Psychiatric: Mood and affect are normal. Speech and behavior are normal.  ____________________________________________   LABS (all labs ordered are listed, but only  abnormal results are displayed)  Results for orders placed or performed during the hospital encounter of 04/09/18 (from the past 24 hour(s))  CBC with Differential/Platelet     Status: None   Collection Time: 04/09/18  5:33 AM  Result Value Ref Range   WBC 5.2 3.6 - 11.0 K/uL   RBC 4.58 3.80 - 5.20 MIL/uL   Hemoglobin 14.9 12.0 - 16.0 g/dL   HCT 96.042.3 45.435.0 - 09.847.0 %   MCV 92.2 80.0 - 100.0 fL   MCH 32.5 26.0 - 34.0 pg   MCHC 35.3 32.0 - 36.0 g/dL   RDW 11.913.3 14.711.5 - 82.914.5 %   Platelets 202 150 - 440 K/uL   Neutrophils Relative % 71 %   Neutro Abs 3.7 1.4 - 6.5 K/uL   Lymphocytes Relative 23 %   Lymphs Abs 1.2 1.0 - 3.6 K/uL   Monocytes Relative 6 %   Monocytes Absolute 0.3 0.2 - 0.9 K/uL   Eosinophils Relative 0 %   Eosinophils Absolute 0.0 0 - 0.7 K/uL   Basophils Relative 0 %   Basophils Absolute 0.0 0 - 0.1 K/uL  Urinalysis, Complete w Microscopic     Status: Abnormal   Collection Time: 04/09/18  5:33 AM  Result Value Ref Range   Color, Urine YELLOW (A) YELLOW   APPearance HAZY (A) CLEAR   Specific Gravity, Urine 1.026 1.005 - 1.030   pH 5.0 5.0 - 8.0   Glucose, UA NEGATIVE NEGATIVE mg/dL   Hgb urine dipstick LARGE (A) NEGATIVE   Bilirubin Urine NEGATIVE NEGATIVE   Ketones, ur 20 (A) NEGATIVE mg/dL   Protein, ur 30 (A) NEGATIVE mg/dL   Nitrite NEGATIVE NEGATIVE   Leukocytes, UA MODERATE (A) NEGATIVE   RBC / HPF >50 (H) 0 - 5 RBC/hpf   WBC, UA 21-50 0 - 5 WBC/hpf   Bacteria, UA NONE SEEN NONE SEEN   Squamous Epithelial / LPF 0-5 0 - 5   Mucus PRESENT   Pregnancy, urine POC     Status: None   Collection Time: 04/09/18  5:42 AM  Result Value Ref Range   Preg Test, Ur NEGATIVE NEGATIVE   ____________________________________________ ____________________________________________  RADIOLOGY  I personally reviewed all radiographic images ordered to evaluate for the above acute complaints and reviewed radiology reports and findings.  These findings were personally discussed  with the patient.  Please see medical record for radiology report.  ____________________________________________   PROCEDURES  Procedure(s) performed:  Procedures    Critical Care performed: no ____________________________________________   INITIAL IMPRESSION / ASSESSMENT AND PLAN / ED COURSE  Pertinent labs & imaging results that were available during my care of the patient were reviewed by me and considered in my medical decision making (see chart for details).   DDX: stone, pyleo, uti, appy, pid, torsion  Rhea E Clint GuyLindley is a 32 y.o. who presents to the ED with right flank pain as described above.  Most clinically consistent with kidney stone.  Abdominal exam is soft and benign.  Blood work will be sent for the above differential.  Will provide IV fluids as well as IV pain medication and antiemetic.  Clinical  Course as of Apr 09 717  Wed Apr 09, 2018  0624 Reassessed.  Pain is improved.  No leukocytosis.  No evidence of infectious process in her urine.  Currently awaiting renal ultrasound.   [PR]    Clinical Course User Index [PR] Willy Eddy, MD   Patient signed out to oncoming provider pending Renal US.    As part of my medical decision making, I reviewed the following data within the electronic MEDICAL RECORD NUMBER Nursing notes reviewed and incorporated, Labs reviewed, notes from prior ED visits.   ____________________________________________   FINAL CLINICAL IMPRESSION(S) / ED DIAGNOSES  Final diagnoses:  Acute right flank pain      NEW MEDICATIONS STARTED DURING THIS VISIT:  New Prescriptions   HYDROCODONE-ACETAMINOPHEN (NORCO) 5-325 MG TABLET    Take 1 tablet by mouth every 4 (four) hours as needed for moderate pain.   PROCHLORPERAZINE (COMPAZINE) 10 MG TABLET    Take 1 tablet (10 mg total) by mouth every 6 (six) hours as needed for nausea or vomiting.   TAMSULOSIN (FLOMAX) 0.4 MG CAPS CAPSULE    Take 1 capsule (0.4 mg total) by mouth daily after  supper.     Note:  This document was prepared using Dragon voice recognition software and may include unintentional dictation errors.    Willy Eddy, MD 04/09/18 [phone]

## 2018-04-09 NOTE — Discharge Instructions (Signed)

## 2018-04-11 LAB — CYTOLOGY - PAP
Diagnosis: NEGATIVE
HPV: DETECTED — AB

## 2018-04-14 ENCOUNTER — Encounter: Payer: Self-pay | Admitting: Obstetrics and Gynecology

## 2018-04-15 ENCOUNTER — Telehealth: Payer: Self-pay | Admitting: Urology

## 2018-04-15 ENCOUNTER — Telehealth: Payer: Self-pay

## 2018-04-15 NOTE — Telephone Encounter (Signed)
-----   Message from Lissa HoardSarah Michelle Watts, CMA sent at 04/14/2018 12:12 PM EDT -----   ----- Message ----- From: Riki AltesStoioff, Scott C, MD Sent: 04/13/2018  10:52 AM To: [facility]  Patient seen in the ED on 7/10 with renal colic and hydronephrosis on ultrasound.  Recommend appointment with any provider

## 2018-04-15 NOTE — Telephone Encounter (Signed)
[name] for pt to cb to schedule an appointment   Kayla DusterMichelle

## 2018-04-15 NOTE — Telephone Encounter (Signed)
Pt has a couple of questions for ABC regarding the phone call yesterday as well as questions about fevers she is having on & off not sure if it's related to kidney stones she had last week. Cb#[phone]

## 2018-04-16 ENCOUNTER — Telehealth: Payer: Self-pay | Admitting: Obstetrics and Gynecology

## 2018-04-16 DIAGNOSIS — N200 Calculus of kidney: Secondary | ICD-10-CM | POA: Diagnosis not present

## 2018-04-16 DIAGNOSIS — R1084 Generalized abdominal pain: Secondary | ICD-10-CM | POA: Diagnosis not present

## 2018-04-16 DIAGNOSIS — R509 Fever, unspecified: Secondary | ICD-10-CM | POA: Diagnosis not present

## 2018-04-16 DIAGNOSIS — R109 Unspecified abdominal pain: Secondary | ICD-10-CM | POA: Diagnosis not present

## 2018-04-16 DIAGNOSIS — Z87442 Personal history of urinary calculi: Secondary | ICD-10-CM | POA: Diagnosis not present

## 2018-04-16 NOTE — Telephone Encounter (Signed)
Pt has been having afternoon fevers/chills/body aches since Monday. Hx of kidney stones/mod hydronephrosis in ED Monday. Pt is also having mid back pain. No fevers/sx in AM. Suggested she f/u with PCP for further eval.

## 2018-04-16 NOTE — Telephone Encounter (Signed)
LMTRC

## 2018-04-16 NOTE — Telephone Encounter (Signed)
Done on different encounter.

## 2018-04-16 NOTE — Telephone Encounter (Signed)
Patient is returning missed call. Please advise 

## 2018-04-25 ENCOUNTER — Telehealth: Payer: Self-pay

## 2018-04-25 NOTE — Telephone Encounter (Signed)
Pt calling triage line stating she has had Kidney stones within the past few weeks. They told her she would need to have her urine checked for blood. She was wanting to know if that is something ABC could do? CB# [phone]

## 2018-04-25 NOTE — Telephone Encounter (Signed)
Called pt and she would like to come and drop off her urine on Monday afternoon to have AileyAlicia check to see if she has any blood in her urine. Please advise.

## 2018-04-28 ENCOUNTER — Ambulatory Visit (INDEPENDENT_AMBULATORY_CARE_PROVIDER_SITE_OTHER): Payer: BLUE CROSS/BLUE SHIELD

## 2018-04-28 ENCOUNTER — Other Ambulatory Visit: Payer: Self-pay | Admitting: Obstetrics and Gynecology

## 2018-04-28 DIAGNOSIS — N390 Urinary tract infection, site not specified: Secondary | ICD-10-CM

## 2018-04-28 LAB — POCT URINALYSIS DIPSTICK
Bilirubin, UA: NEGATIVE
Blood, UA: NEGATIVE
Glucose, UA: NEGATIVE
Ketones, UA: NEGATIVE
Leukocytes, UA: NEGATIVE
Nitrite, UA: NEGATIVE
Protein, UA: NEGATIVE
Spec Grav, UA: 1.01 (ref 1.010–1.025)
Urobilinogen, UA: NEGATIVE E.U./dL — AB
pH, UA: 5 (ref 5.0–8.0)

## 2018-04-28 NOTE — Telephone Encounter (Signed)
Pt aware of neg POCT UA. Pt needed to f/u to make sure hematuria after kidney stones/abx tx resolved. F/u prn.

## 2018-04-28 NOTE — Telephone Encounter (Signed)
That's fine. Pls let pt know she can come in anytime. Thx.

## 2018-05-29 ENCOUNTER — Telehealth: Payer: Self-pay

## 2018-05-29 NOTE — Telephone Encounter (Signed)
Pt requesting to switch back to Depo from the pill. Requesting ABC send in rx & states she is available for apt next Thursday or Friday to come get the shot. Cb#[phone]

## 2018-05-29 NOTE — Telephone Encounter (Signed)
Spoke w/pt to verify pharmacy [facility] [address] Also notified pt if not actively bleeding at apt for Depo, pregnancy test will be performed. Pt will be notified when rx has been sent & scheduled for an apt.

## 2018-05-30 ENCOUNTER — Other Ambulatory Visit: Payer: Self-pay | Admitting: Obstetrics and Gynecology

## 2018-05-30 MED ORDER — MEDROXYPROGESTERONE ACETATE 150 MG/ML IM SUSY: 150.0000 mg | PREFILLED_SYRINGE | Freq: Once | INTRAMUSCULAR | 2 refills | Status: DC

## 2018-05-30 NOTE — Telephone Encounter (Signed)
Depo eRxd. RN to notify pt and sched depo appt. See note above. Thx

## 2018-05-30 NOTE — Progress Notes (Signed)
Rx change to depo from OCPs. RTO for inj.

## 2018-05-30 NOTE — Telephone Encounter (Signed)
Pt aware.

## 2018-06-05 ENCOUNTER — Ambulatory Visit (INDEPENDENT_AMBULATORY_CARE_PROVIDER_SITE_OTHER): Payer: BLUE CROSS/BLUE SHIELD

## 2018-06-05 DIAGNOSIS — Z3042 Encounter for surveillance of injectable contraceptive: Secondary | ICD-10-CM

## 2018-06-05 DIAGNOSIS — Z3041 Encounter for surveillance of contraceptive pills: Secondary | ICD-10-CM

## 2018-06-05 MED ORDER — MEDROXYPROGESTERONE ACETATE 150 MG/ML IM SUSP
150.0000 mg | Freq: Once | INTRAMUSCULAR | Status: AC
  Administered 2018-06-05: 150 mg via INTRAMUSCULAR

## 2018-07-30 ENCOUNTER — Ambulatory Visit (INDEPENDENT_AMBULATORY_CARE_PROVIDER_SITE_OTHER): Payer: Self-pay | Admitting: Obstetrics and Gynecology

## 2018-07-30 ENCOUNTER — Encounter: Payer: Self-pay | Admitting: Obstetrics and Gynecology

## 2018-07-30 VITALS — BP 106/60 | HR 71 | Ht 65.0 in | Wt 150.0 lb

## 2018-07-30 DIAGNOSIS — N3001 Acute cystitis with hematuria: Secondary | ICD-10-CM

## 2018-07-30 LAB — POCT URINALYSIS DIPSTICK
Bilirubin, UA: NEGATIVE
Glucose, UA: NEGATIVE
Ketones, UA: NEGATIVE
Nitrite, UA: NEGATIVE
Protein, UA: NEGATIVE
Spec Grav, UA: 1.01 (ref 1.010–1.025)
pH, UA: 6.5 (ref 5.0–8.0)

## 2018-07-30 MED ORDER — NITROFURANTOIN MONOHYD MACRO 100 MG PO CAPS: 100.0000 mg | ORAL_CAPSULE | Freq: Two times a day (BID) | ORAL | 0 refills | Status: AC

## 2018-07-30 NOTE — Patient Instructions (Signed)
I value your feedback and entrusting us with your care. If you get a [place] patient survey, I would appreciate you taking the time to let us know about your experience today. Thank you! 

## 2018-07-30 NOTE — Progress Notes (Signed)
Townsend, Kayla Sorrel, PA-C   Chief Complaint  Patient presents with  . Urinary Tract Infection    frequent urination, no burning or blood, there is odor x 2 days    HPI:      Kayla Townsend is a 32 y.o. G0P0000 who LMP was Patient's last menstrual period was 06/14/2018 (approximate)., presents today for UTI sx of urinary frequency/urgency and odor for the past 2 days. No LBP, belly pain, fevers, hematuria. Taking AZO. Hx of kidney stones and UTIs in past. No vag sx/no vag bleeding. Doing well on depo.    Past Medical History:  Diagnosis Date  . Cervical high risk human papillomavirus (HPV) DNA test positive 03/2018  . Cholelithiases 03/2018   on renal u/s, no sx  . Kidney stone     History reviewed. No pertinent surgical history.  Family History  Problem Relation Age of Onset  . Diabetes Maternal Grandfather   . Lung cancer Paternal Grandfather   . Breast cancer Neg Hx   . Ovarian cancer Neg Hx     Social History   Socioeconomic History  . Marital status: Single    Spouse name: Not on file  . Number of children: Not on file  . Years of education: Not on file  . Highest education level: Not on file  Occupational History  . Not on file  Social Needs  . Financial resource strain: Not on file  . Food insecurity:    Worry: Not on file    Inability: Not on file  . Transportation needs:    Medical: Not on file    Non-medical: Not on file  Tobacco Use  . Smoking status: Never Smoker  . Smokeless tobacco: Never Used  Substance and Sexual Activity  . Alcohol use: No  . Drug use: No  . Sexual activity: Yes    Birth control/protection: Injection  Lifestyle  . Physical activity:    Days per week: Not on file    Minutes per session: Not on file  . Stress: Not on file  Relationships  . Social connections:    Talks on phone: Not on file    Gets together: Not on file    Attends religious service: Not on file    Active member of club or organization: Not on  file    Attends meetings of clubs or organizations: Not on file    Relationship status: Not on file  . Intimate partner violence:    Fear of current or ex partner: Not on file    Emotionally abused: Not on file    Physically abused: Not on file    Forced sexual activity: Not on file  Other Topics Concern  . Not on file  Social History Narrative  . Not on file    Outpatient Medications Prior to Visit  Medication Sig Dispense Refill  . tretinoin (RETIN-A) 0.025 % cream APPLY A PEA SIZED AMOUNT TO DRY FACE AND BACK AT BEDTIME.    . medroxyPROGESTERone Acetate 150 MG/ML SUSY Inject 1 mL (150 mg total) into the muscle once for 1 dose. 1 Syringe 2  . clindamycin (CLEOCIN T) 1 % lotion APP A THIN FILM TO FACE EVERY MORNING  4  . HYDROcodone-acetaminophen (NORCO) 5-325 MG tablet Take 1 tablet by mouth every 4 (four) hours as needed for moderate pain. 6 tablet 0  . naproxen (NAPROSYN) 500 MG tablet Take 1 tablet (500 mg total) by mouth 2 (two) times daily with  a meal. (Patient not taking: Reported on 04/09/2018) 20 tablet 0  . ondansetron (ZOFRAN ODT) 4 MG disintegrating tablet Take 1 tablet (4 mg total) by mouth every 8 (eight) hours as needed for nausea or vomiting. (Patient not taking: Reported on 04/09/2018) 20 tablet 0  . oxyCODONE (ROXICODONE) 5 MG immediate release tablet Take 1 tablet (5 mg total) by mouth every 6 (six) hours as needed for breakthrough pain. (Patient not taking: Reported on 04/09/2018) 6 tablet 0  . prochlorperazine (COMPAZINE) 10 MG tablet Take 1 tablet (10 mg total) by mouth every 6 (six) hours as needed for nausea or vomiting. 12 tablet 0  . tamsulosin (FLOMAX) 0.4 MG CAPS capsule Take 1 capsule (0.4 mg total) by mouth daily after supper. 10 capsule 0   No facility-administered medications prior to visit.       ROS:  Review of Systems  Constitutional: Negative for fever.  Gastrointestinal: Negative for blood in stool, constipation, diarrhea, nausea and vomiting.    Genitourinary: Positive for frequency and urgency. Negative for dyspareunia, dysuria, flank pain, hematuria, vaginal bleeding, vaginal discharge and vaginal pain.  Musculoskeletal: Negative for back pain.  Skin: Negative for rash.    OBJECTIVE:   Vitals:  BP 106/60   Pulse 71   Ht 5\' 5"  (1.651 m)   Wt 150 lb (68 kg)   LMP 06/14/2018 (Approximate)   BMI 24.96 kg/m   Physical Exam  Constitutional: She is oriented to person, place, and time. She appears well-developed.  Neck: Normal range of motion.  Pulmonary/Chest: Effort normal.  Abdominal: There is no CVA tenderness.  Neurological: She is alert and oriented to person, place, and time. No cranial nerve deficit.  Psychiatric: She has a normal mood and affect. Her behavior is normal. Judgment and thought content normal.  Vitals reviewed.   Results: Results for orders placed or performed in visit on 07/30/18 (from the past 24 hour(s))  POCT Urinalysis Dipstick     Status: Abnormal   Collection Time: 07/30/18  4:46 PM  Result Value Ref Range   Color, UA yellow    Clarity, UA cloudy    Glucose, UA Negative Negative   Bilirubin, UA neg    Ketones, UA neg    Spec Grav, UA 1.010 1.010 - 1.025   Blood, UA small    pH, UA 6.5 5.0 - 8.0   Protein, UA Negative Negative   Urobilinogen, UA     Nitrite, UA neg    Leukocytes, UA Small (1+) (A) Negative   Appearance     Odor       Assessment/Plan: Acute cystitis with hematuria - Pos sx/UA. Rx macrobid. Check C&S. F/u prn.  - Plan: POCT Urinalysis Dipstick, Urine Culture, nitrofurantoin, macrocrystal-monohydrate, (MACROBID) 100 MG capsule    Meds ordered this encounter  Medications  . nitrofurantoin, macrocrystal-monohydrate, (MACROBID) 100 MG capsule    Sig: Take 1 capsule (100 mg total) by mouth 2 (two) times daily for 5 days.    Dispense:  10 capsule    Refill:  0    Order Specific Question:   Supervising Provider    Answer:   Nadara Mustard [[id number]]      Return  if symptoms worsen or fail to improve.  Alicia B. Copland, PA-C 07/30/2018 4:48 PM

## 2018-08-02 LAB — URINE CULTURE

## 2018-08-03 ENCOUNTER — Telehealth: Payer: Self-pay | Admitting: Obstetrics and Gynecology

## 2018-08-03 ENCOUNTER — Other Ambulatory Visit: Payer: Self-pay | Admitting: Obstetrics and Gynecology

## 2018-08-03 MED ORDER — SULFAMETHOXAZOLE-TRIMETHOPRIM 800-160 MG PO TABS: 1.0000 | ORAL_TABLET | Freq: Two times a day (BID) | ORAL | 0 refills | Status: DC

## 2018-08-03 NOTE — Telephone Encounter (Signed)
Pt aware of C&S results and resistance to Macrobid. Rx changed to bactrim. Urgency has improved. F/u prn.

## 2018-08-22 ENCOUNTER — Ambulatory Visit: Payer: Self-pay

## 2018-08-25 ENCOUNTER — Telehealth: Payer: Self-pay

## 2018-08-25 NOTE — Telephone Encounter (Signed)
Pt aware. I transferred her [name] to be put on nurse's schedule.

## 2018-08-25 NOTE — Telephone Encounter (Signed)
She needs to bring urine by for UA. Can sched nurse visit.

## 2018-08-25 NOTE — Telephone Encounter (Signed)
Pt says her urine is still cloudy, has seen some blood when she wipes, after she urinates she still feels the need to urinate, no burning sensation or frequency. She has finished abx.

## 2018-08-25 NOTE — Telephone Encounter (Signed)
Pt doesn't think antibx is working as she still has two fo the sxs.  Calling to see if another [facility] [address]  [phone]

## 2018-08-27 ENCOUNTER — Ambulatory Visit (INDEPENDENT_AMBULATORY_CARE_PROVIDER_SITE_OTHER): Payer: Self-pay

## 2018-08-27 DIAGNOSIS — Z3042 Encounter for surveillance of injectable contraceptive: Secondary | ICD-10-CM

## 2018-08-27 DIAGNOSIS — R300 Dysuria: Secondary | ICD-10-CM

## 2018-08-27 LAB — POCT URINALYSIS DIPSTICK
Bilirubin, UA: NEGATIVE
Blood, UA: NEGATIVE
Glucose, UA: NEGATIVE
Ketones, UA: NEGATIVE
Leukocytes, UA: NEGATIVE
Nitrite, UA: NEGATIVE
Protein, UA: NEGATIVE
Spec Grav, UA: 1.02 (ref 1.010–1.025)
Urobilinogen, UA: NEGATIVE E.U./dL — AB
pH, UA: 5.5 (ref 5.0–8.0)

## 2018-08-27 MED ORDER — MEDROXYPROGESTERONE ACETATE 150 MG/ML IM SUSP
150.0000 mg | Freq: Once | INTRAMUSCULAR | Status: AC
  Administered 2018-08-27: 150 mg via INTRAMUSCULAR

## 2018-11-19 ENCOUNTER — Ambulatory Visit (INDEPENDENT_AMBULATORY_CARE_PROVIDER_SITE_OTHER): Payer: BLUE CROSS/BLUE SHIELD

## 2018-11-19 DIAGNOSIS — Z3042 Encounter for surveillance of injectable contraceptive: Secondary | ICD-10-CM | POA: Diagnosis not present

## 2018-11-19 MED ORDER — MEDROXYPROGESTERONE ACETATE 150 MG/ML IM SUSP
150.0000 mg | Freq: Once | INTRAMUSCULAR | Status: AC
  Administered 2018-11-19: 150 mg via INTRAMUSCULAR

## 2018-12-05 DIAGNOSIS — H5203 Hypermetropia, bilateral: Secondary | ICD-10-CM | POA: Diagnosis not present

## 2019-01-02 ENCOUNTER — Telehealth: Payer: Self-pay

## 2019-01-02 ENCOUNTER — Other Ambulatory Visit: Payer: Self-pay | Admitting: Obstetrics and Gynecology

## 2019-01-02 MED ORDER — SULFAMETHOXAZOLE-TRIMETHOPRIM 800-160 MG PO TABS: 1.0000 | ORAL_TABLET | Freq: Two times a day (BID) | ORAL | 0 refills | Status: DC

## 2019-01-02 NOTE — Telephone Encounter (Signed)
Pt called triage line stating she believes she has another UTI/Bladder infection. She gets these right regular. The last time she was able to just drop off the urine. Please advise on what she needs to do. CB# [phone]

## 2019-01-02 NOTE — Telephone Encounter (Signed)
Pls notify pt abx eRxd for pt. F/u for urine check if sx persist.

## 2019-01-02 NOTE — Telephone Encounter (Signed)
Pt aware.

## 2019-01-05 ENCOUNTER — Telehealth: Payer: Self-pay | Admitting: Obstetrics and Gynecology

## 2019-01-05 ENCOUNTER — Other Ambulatory Visit: Payer: Self-pay | Admitting: Obstetrics and Gynecology

## 2019-01-05 MED ORDER — CIPROFLOXACIN HCL 500 MG PO TABS: 500.0000 mg | ORAL_TABLET | Freq: Two times a day (BID) | ORAL | 0 refills | Status: AC

## 2019-01-05 NOTE — Telephone Encounter (Signed)
Patient calling with flank pain, lower right back achy, last night had chills/fever but took OTC med and this has eased off, just still flank pain, has history of kidney stone/infection. Also says she was just given Rx for UTI.  Appt in office or phone visit needed?

## 2019-01-05 NOTE — Telephone Encounter (Signed)
Need to see her for UA. I'm here this AM, pls call to sched.

## 2019-01-05 NOTE — Telephone Encounter (Signed)
Pt with mild fever today (99.6). Having flank pain for a couple days, took leftover hydrocodone and had vomiting about 30 min later. Has had vomiting due to pain with kidney stones in past. Pt started on bactrim 3 days ago. Will change to cipro in case bacterial vs kidney stone. If sx worsen, go to ED. If sx stable/improve, pt to let me know tomorrow via phone. F/u prn.

## 2019-01-05 NOTE — Progress Notes (Signed)
Rx cipro for UTI sx.

## 2019-01-06 ENCOUNTER — Emergency Department: Payer: BLUE CROSS/BLUE SHIELD

## 2019-01-06 ENCOUNTER — Emergency Department
Admission: EM | Admit: 2019-01-06 | Discharge: 2019-01-06 | Disposition: A | Discharge status: Home / Self Care | Payer: BLUE CROSS/BLUE SHIELD | Attending: Emergency Medicine | Admitting: Emergency Medicine

## 2019-01-06 ENCOUNTER — Encounter: Payer: Self-pay | Admitting: Emergency Medicine

## 2019-01-06 ENCOUNTER — Other Ambulatory Visit: Payer: Self-pay

## 2019-01-06 DIAGNOSIS — Z87442 Personal history of urinary calculi: Secondary | ICD-10-CM | POA: Diagnosis not present

## 2019-01-06 DIAGNOSIS — N12 Tubulo-interstitial nephritis, not specified as acute or chronic: Secondary | ICD-10-CM | POA: Insufficient documentation

## 2019-01-06 DIAGNOSIS — R109 Unspecified abdominal pain: Secondary | ICD-10-CM | POA: Diagnosis present

## 2019-01-06 DIAGNOSIS — N200 Calculus of kidney: Secondary | ICD-10-CM | POA: Diagnosis not present

## 2019-01-06 LAB — POCT PREGNANCY, URINE: Preg Test, Ur: NEGATIVE

## 2019-01-06 LAB — CBC
HCT: 39.4 % (ref 36.0–46.0)
Hemoglobin: 13.6 g/dL (ref 12.0–15.0)
MCH: 31.9 pg (ref 26.0–34.0)
MCHC: 34.5 g/dL (ref 30.0–36.0)
MCV: 92.5 fL (ref 80.0–100.0)
Platelets: 177 10*3/uL (ref 150–400)
RBC: 4.26 MIL/uL (ref 3.87–5.11)
RDW: 13.3 % (ref 11.5–15.5)
WBC: 9.8 10*3/uL (ref 4.0–10.5)
nRBC: 0 % (ref 0.0–0.2)

## 2019-01-06 LAB — URINALYSIS, COMPLETE (UACMP) WITH MICROSCOPIC
Bacteria, UA: NONE SEEN
Bilirubin Urine: NEGATIVE
Glucose, UA: 50 mg/dL — AB
Ketones, ur: 20 mg/dL — AB
Nitrite: NEGATIVE
Protein, ur: 100 mg/dL — AB
RBC / HPF: >50 RBC/hpf — ABNORMAL HIGH (ref 0–5)
Specific Gravity, Urine: 1.019 (ref 1.005–1.030)
WBC, UA: >50 WBC/hpf — ABNORMAL HIGH (ref 0–5)
pH: 6 (ref 5.0–8.0)

## 2019-01-06 LAB — BASIC METABOLIC PANEL
Anion gap: 9 (ref 5–15)
BUN: 12 mg/dL (ref 6–20)
CO2: 25 mmol/L (ref 22–32)
Calcium: 8.9 mg/dL (ref 8.9–10.3)
Chloride: 104 mmol/L (ref 98–111)
Creatinine, Ser: 0.82 mg/dL (ref 0.44–1.00)
GFR calc Af Amer: >60 mL/min (ref >60)
GFR calc non Af Amer: >60 mL/min (ref >60)
Glucose, Bld: 153 mg/dL — ABNORMAL HIGH (ref 70–99)
Potassium: 4.1 mmol/L (ref 3.5–5.1)
Sodium: 138 mmol/L (ref 135–145)

## 2019-01-06 MED ORDER — SODIUM CHLORIDE 0.9 % IV SOLN
1.0000 g | Freq: Once | INTRAVENOUS | Status: DC
  Filled 2019-01-06: qty 10

## 2019-01-06 MED ORDER — SODIUM CHLORIDE 0.9 % IV SOLN
1.0000 g | Freq: Once | INTRAVENOUS | Status: AC
  Administered 2019-01-06: 1 g via INTRAVENOUS

## 2019-01-06 MED ORDER — CEPHALEXIN 500 MG PO CAPS: 500.0000 mg | ORAL_CAPSULE | Freq: Three times a day (TID) | ORAL | 0 refills | Status: AC

## 2019-01-06 NOTE — ED Notes (Signed)
Pt with hx of allergy to rocephin, Md Malinda gave pt IV small does to trial ability to tolerated medication. No complication. Will move forward with IV rocephin drip. PT will remain on cardiac, BP and SpO2 monitoring at this time. With call bell in reach.

## 2019-01-06 NOTE — ED Provider Notes (Signed)
[facility] Emergency Department Provider Note   ____________________________________________   First MD Initiated Contact with Patient 01/06/19 1528     (approximate)  I have reviewed the triage vital signs and the nursing notes.   HISTORY  Chief Complaint Flank Pain    HPI Kayla Townsend is a 33 y.o. female patient complains of flank pain.  Is been going on for several days.  She initially was taking some Bactrim for what they thought was a UTI and switched to Cipro 2 days ago she is had 4 doses of Cipro.  Maximum temperature is 100.4 that she had today.  Been having pain in the right flank area radiating toward the groin but mostly stays in the back.  She has some hazy yellow urine.  She thinks maybe she has a kidney infection or kidney stone she is not sure which.  She had kidney stones in the past.        Past Medical History:  Diagnosis Date  . Cervical high risk human papillomavirus (HPV) DNA test positive 03/2018  . Cholelithiases 03/2018   on renal u/s, no sx  . Kidney stone     There are no active problems to display for this patient.   History reviewed. No pertinent surgical history.  Prior to Admission medications   Medication Sig Start Date End Date Taking? Authorizing Provider  cephALEXin (KEFLEX) 500 MG capsule Take 1 capsule (500 mg total) by mouth 3 (three) times daily for 10 days. 01/06/19 01/16/19  Arnaldo Natal, MD  ciprofloxacin (CIPRO) 500 MG tablet Take 1 tablet (500 mg total) by mouth 2 (two) times daily for 5 days. 01/05/19 01/10/19  Copland, Ilona Sorrel, PA-C  medroxyPROGESTERone Acetate 150 MG/ML SUSY Inject 1 mL (150 mg total) into the muscle once for 1 dose. 05/30/18 05/30/18  Copland, Ilona Sorrel, PA-C  tretinoin (RETIN-A) 0.025 % cream APPLY A PEA SIZED AMOUNT TO DRY FACE AND BACK AT BEDTIME. 10/29/17   [provider]    Allergies Ceclor [cefaclor]  Family History  Problem Relation Age of Onset  . Diabetes  Maternal Grandfather   . Lung cancer Paternal Grandfather   . Breast cancer Neg Hx   . Ovarian cancer Neg Hx     Social History Social History   Tobacco Use  . Smoking status: Never Smoker  . Smokeless tobacco: Never Used  Substance Use Topics  . Alcohol use: No  . Drug use: No    Review of Systems  Constitutional:  fever/chills Eyes: No visual changes. ENT: No sore throat. Cardiovascular: Denies chest pain. Respiratory: Denies shortness of breath. Gastrointestinal: No abdominal pain.  No nausea, no vomiting.  No diarrhea.  No constipation.  Patient has right flank pain Genitourinary: Negative for dysuria. Musculoskeletal: Negative for back pain. Skin: Negative for rash. Neurological: Negative for headaches, focal weakness  ____________________________________________   PHYSICAL EXAM:  VITAL SIGNS: ED Triage Vitals  Enc Vitals Group     BP 01/06/19 1431 111/70     Pulse Rate 01/06/19 1431 (!) 101     Resp --      Temp 01/06/19 1431 98.7 F (37.1 C)     Temp Source 01/06/19 1431 Oral     SpO2 01/06/19 1431 97 %     Weight 01/06/19 1429 138 lb (62.6 kg)     Height 01/06/19 1429  (1.651 m)     Head Circumference --      Peak Flow --  Pain Score 01/06/19 1428 0     Pain Loc --      Pain Edu? --      Excl. in GC? --     Constitutional: Alert and oriented. Well appearing and in no acute distress. Eyes: Conjunctivae are normal.  Head: Atraumatic. Nose: No congestion/rhinnorhea. Mouth/Throat: Mucous membranes are moist.  Oropharynx non-erythematous. Neck: No stridor.  Cardiovascular: Normal rate, regular rhythm. Grossly normal heart sounds.  Good peripheral circulation. Respiratory: Normal respiratory effort.  No retractions. Lungs CTAB. Gastrointestinal: Soft and nontender. No distention. No abdominal bruits.  Right CVA tenderness. Musculoskeletal: No lower extremity tenderness nor edema.   Neurologic:  Normal speech and language. No gross focal  neurologic deficits are appreciated. Skin:  Skin is warm, dry and intact. No rash noted. l.  ____________________________________________   LABS (all labs ordered are listed, but only abnormal results are displayed)  Labs Reviewed  URINALYSIS, COMPLETE (UACMP) WITH MICROSCOPIC - Abnormal; Notable for the following components:      Result Value   Color, Urine YELLOW (*)    APPearance HAZY (*)    Glucose, UA 50 (*)    Hgb urine dipstick MODERATE (*)    Ketones, ur 20 (*)    Protein, ur 100 (*)    Leukocytes,Ua MODERATE (*)    RBC / HPF >50 (*)    WBC, UA >50 (*)    Non Squamous Epithelial PRESENT (*)    All other components within normal limits  BASIC METABOLIC PANEL - Abnormal; Notable for the following components:   Glucose, Bld 153 (*)    All other components within normal limits  CBC  POC URINE PREG, ED  POCT PREGNANCY, URINE   ____________________________________________  EKG   ____________________________________________  RADIOLOGY  ED MD interpretation: Patient CT read by radiology shows no obstruction no real kidney stones there is a lot of swelling and infiltration of the fat around the kidney.  This looks like pyelonephritis.  Official radiology report(s): Ct Renal Stone Study  Result Date: 01/06/2019 CLINICAL DATA:  33 year old female with a history right flank pain and concern for nephrolithiasis EXAM: CT ABDOMEN AND PELVIS WITHOUT CONTRAST TECHNIQUE: Multidetector CT imaging of the abdomen and pelvis was performed following the standard protocol without IV contrast. COMPARISON:  05/17/2013, 10/22/2017 FINDINGS: Lower chest: No acute finding Hepatobiliary: Unremarkable liver.  Unremarkable gallbladder Pancreas: Unremarkable Spleen: Unremarkable Adrenals/Urinary Tract: Bilateral adrenal glands unremarkable. Right kidney without hydronephrosis. There are punctate calcifications in the collecting system, likely representing tiny nonobstructing stones. No stones in  the pelvis or along the course the right ureter. There is hazy infiltration of the fat surrounding the right kidney, and extending inferior to the kidney along the anterior psoas. Infiltration of the fat in the hilum of the right kidney. The left kidney demonstrates single punctate calcification lateral cortex with no hydronephrosis. No significant inflammatory changes or edema surrounding the left kidney. Urinary bladder relatively decompressed with no stones in the urinary bladder. Stomach/Bowel: Unremarkable stomach. Unremarkable small bowel. Normal appendix. Unremarkable colon. Vascular/Lymphatic: No significant atherosclerotic changes. No adenopathy. Reproductive: Unremarkable uterus and adnexa Other: None Musculoskeletal: No displaced fracture or significant degenerative changes. IMPRESSION: Perinephric standing on the right with significant edema in the fat concerning for pyelonephritis/ascending urinary tract infection. Recommend correlation with urinalysis. No hydronephrosis, with only punctate non-obstructing bilateral nephrolithiasis, which is improved from the comparison CT. Electronically Signed   By: Gilmer Mor D.O.   On: 01/06/2019 16:11    ____________________________________________   PROCEDURES  Procedure(s) performed (including Critical Care):  Procedures   ____________________________________________   INITIAL IMPRESSION / ASSESSMENT AND PLAN / ED COURSE Patient reports her allergic reaction was to Ceclor as a child she had her hands and feet swell up.  This sounds like it was her serum sickness reaction.    I discussed this with the patient.  She agreed to let me try some Rocephin IV.  I gave her 2 cc of the Rocephin IV and watched her for 10 minutes nothing happened I gave her another 2-1/2 cc and nothing happened again we will give her a gram IV. Patient tolerated a gram of Rocephin well.  We will let her go home.  She will return for any further problems.  We will have  her take her both the Keflex and the Cipro.         ____________________________________________   FINAL CLINICAL IMPRESSION(S) / ED DIAGNOSES  Final diagnoses:  Pyelonephritis     ED Discharge Orders         Ordered    cephALEXin (KEFLEX) 500 MG capsule  3 times daily     01/06/19 1914           Note:  This document was prepared using Dragon voice recognition software and may include unintentional dictation errors.    Arnaldo NatalMalinda, Paul F, MD 01/07/19 Perlie Mayo0020

## 2019-01-06 NOTE — ED Triage Notes (Addendum)
Pt here for right flank pain. Thinks she has a kidney stone.  Started out feeling like UTI but now thinks maybe stone.  Pt denies pain now after taking pain meds.  Fevers tmax 100.4 today.  Ambulatory. meds at noon

## 2019-01-06 NOTE — Telephone Encounter (Signed)
Pt's mom called stating pt now has fever 100.4 with body aches and headache. Was treated for possible UTI due to sx with 2 different abx without relief. Concern of possible pyelo vs kidney stone yesterday due to sx change. Pt needs to f/u with PCP/urgent care to rule out kidney stone, but may also be Covid. Pt's mom aware.

## 2019-01-06 NOTE — ED Notes (Signed)
Pt verbalized understanding of d/c instructions, RX, and f/u care. No further questions at this time. Pt ambulatory to the exit with steady gait  

## 2019-01-06 NOTE — ED Notes (Signed)
MD at bedside for pt update

## 2019-01-06 NOTE — ED Notes (Signed)
Patient transported to CT 

## 2019-01-06 NOTE — Discharge Instructions (Signed)
Continue taking the Cipro twice a day.  Add Keflex 1 pill 3 times a day.  Please return for any itching or rash or any other signs of allergic reaction.  Also please return if you are not feeling better in about 2 days and get your doctor see you in about a week to make sure you are well.

## 2019-01-07 ENCOUNTER — Telehealth: Payer: Self-pay | Admitting: Obstetrics and Gynecology

## 2019-01-07 NOTE — Telephone Encounter (Signed)
Spoke with pt. Went to ED yesterday and had neg CT scan for kidney stones. Suggestive of pyelonephritis. Given IV rocephin and added keflex to cipro. Pt still has temp of 100.5 today and doesn't feel great. Alternating tylenol and ibup for fever. Cont fluids, f/u prn sx.  Also discussed fever, body aches if sx don't improve could be covid. Suggested self-quarantine, hand washing, etc.

## 2019-01-08 NOTE — Telephone Encounter (Signed)
Spoke with pt today. Fever is going down/still low grade this morning. Flank pain and body aches resolved. Urin sx improved. Doing better. F/u prn.

## 2019-02-11 ENCOUNTER — Other Ambulatory Visit: Payer: Self-pay

## 2019-02-11 ENCOUNTER — Ambulatory Visit (INDEPENDENT_AMBULATORY_CARE_PROVIDER_SITE_OTHER): Payer: BLUE CROSS/BLUE SHIELD

## 2019-02-11 ENCOUNTER — Other Ambulatory Visit: Payer: Self-pay | Admitting: Obstetrics and Gynecology

## 2019-02-11 DIAGNOSIS — Z3042 Encounter for surveillance of injectable contraceptive: Secondary | ICD-10-CM

## 2019-02-11 MED ORDER — MEDROXYPROGESTERONE ACETATE 150 MG/ML IM SUSP
150.0000 mg | Freq: Once | INTRAMUSCULAR | Status: AC
  Administered 2019-02-11: 150 mg via INTRAMUSCULAR

## 2019-02-11 NOTE — Progress Notes (Signed)
Patient presents today for Depo Provera injection within dates. Given IM LUOQ. Patient tolerated well. 

## 2019-02-11 NOTE — Telephone Encounter (Signed)
Pt calling for depo refill to be sent to [facility] on [address]  Has appt at 4pm today.  [phone]  Pt aware refill eRx'd.

## 2019-04-13 ENCOUNTER — Ambulatory Visit: Payer: BLUE CROSS/BLUE SHIELD | Admitting: Obstetrics and Gynecology

## 2019-04-17 ENCOUNTER — Other Ambulatory Visit (HOSPITAL_COMMUNITY)
Admission: RE | Admit: 2019-04-17 | Discharge: 2019-04-17 | Disposition: A | Discharge status: Home / Self Care | Payer: BC Managed Care – PPO | Source: Ambulatory Visit | Attending: Obstetrics and Gynecology | Admitting: Obstetrics and Gynecology

## 2019-04-17 ENCOUNTER — Other Ambulatory Visit: Payer: Self-pay

## 2019-04-17 ENCOUNTER — Encounter: Payer: Self-pay | Admitting: Obstetrics and Gynecology

## 2019-04-17 ENCOUNTER — Ambulatory Visit (INDEPENDENT_AMBULATORY_CARE_PROVIDER_SITE_OTHER): Payer: BC Managed Care – PPO | Admitting: Obstetrics and Gynecology

## 2019-04-17 VITALS — BP 120/78 | Ht 65.0 in | Wt 152.0 lb

## 2019-04-17 DIAGNOSIS — R87810 Cervical high risk human papillomavirus (HPV) DNA test positive: Secondary | ICD-10-CM | POA: Diagnosis not present

## 2019-04-17 DIAGNOSIS — Z1151 Encounter for screening for human papillomavirus (HPV): Secondary | ICD-10-CM | POA: Diagnosis not present

## 2019-04-17 DIAGNOSIS — Z01419 Encounter for gynecological examination (general) (routine) without abnormal findings: Secondary | ICD-10-CM | POA: Diagnosis not present

## 2019-04-17 DIAGNOSIS — Z124 Encounter for screening for malignant neoplasm of cervix: Secondary | ICD-10-CM

## 2019-04-17 DIAGNOSIS — Z3042 Encounter for surveillance of injectable contraceptive: Secondary | ICD-10-CM

## 2019-04-17 MED ORDER — MEDROXYPROGESTERONE ACETATE 150 MG/ML IM SUSY: 150.0000 mg | PREFILLED_SYRINGE | INTRAMUSCULAR | 3 refills | Status: DC

## 2019-04-17 NOTE — Patient Instructions (Signed)
I value your feedback and entrusting us with your care. If you get a [facility] patient survey, I would appreciate you taking the time to let us know about your experience today. Thank you! 

## 2019-04-17 NOTE — Progress Notes (Signed)
Chief Complaint  Patient presents with  . Gynecologic Exam     HPI:      Kayla Townsend is a 33 y.o. G0P0000 who LMP was No LMP recorded. Patient has had an injection., presents today for her annual examination. Her menses are absent with depo. Dysmenorrhea none. She does not have intermenstrual bleeding. Changed from depo to OCPs 10/18 in order to get off depo. Had headaches with estrogen containing OCPs, including sprintec and aviane, so went back to depo. Headaches improved. Didn't like IUD in the past.  Sex activity: sexually active--using depo provera inj.  Last Pap: 04/09/18  Results were: no abnormalities /POS HPV DNA; repeat due this yr Hx of STDs: none  There is no FH of breast cancer. There is no FH of ovarian cancer. The patient does do self-breast exams.  Tobacco use: The patient denies current or previous tobacco use. Alcohol use: none Drug use: none Exercise: very active  She does get adequate calcium but not Vitamin D in her diet.   Past Medical History:  Diagnosis Date  . Cervical high risk human papillomavirus (HPV) DNA test positive 03/2018  . Cholelithiases 03/2018   on renal u/s, no sx  . Kidney stone     History reviewed. No pertinent surgical history.  Family History  Problem Relation Age of Onset  . Diabetes Maternal Grandfather   . Lung cancer Paternal Grandfather   . Breast cancer Neg Hx   . Ovarian cancer Neg Hx     Social History   Socioeconomic History  . Marital status: Single    Spouse name: Not on file  . Number of children: Not on file  . Years of education: Not on file  . Highest education level: Not on file  Occupational History  . Not on file  Social Needs  . Financial resource strain: Not on file  . Food insecurity    Worry: Not on file    Inability: Not on file  . Transportation needs    Medical: Not on file    Non-medical: Not on file  Tobacco Use  . Smoking status: Never Smoker  . Smokeless tobacco:  Never Used  Substance and Sexual Activity  . Alcohol use: No  . Drug use: No  . Sexual activity: Yes    Birth control/protection: Injection  Lifestyle  . Physical activity    Days per week: Not on file    Minutes per session: Not on file  . Stress: Not on file  Relationships  . Social Herbalist on phone: Not on file    Gets together: Not on file    Attends religious service: Not on file    Active member of club or organization: Not on file    Attends meetings of clubs or organizations: Not on file    Relationship status: Not on file  . Intimate partner violence    Fear of current or ex partner: Not on file    Emotionally abused: Not on file    Physically abused: Not on file    Forced sexual activity: Not on file  Other Topics Concern  . Not on file  Social History Narrative  . Not on file    Current Outpatient Medications on File Prior to Visit  Medication Sig Dispense Refill  . tretinoin (RETIN-A) 0.025 % cream APPLY A PEA SIZED AMOUNT TO DRY FACE AND BACK AT BEDTIME.     No current facility-administered medications  on file prior to visit.       ROS:  Review of Systems  Constitutional: Negative for fatigue, fever and unexpected weight change.  Respiratory: Negative for cough, shortness of breath and wheezing.   Cardiovascular: Negative for chest pain, palpitations and leg swelling.  Gastrointestinal: Negative for blood in stool, constipation, diarrhea, nausea and vomiting.  Endocrine: Negative for cold intolerance, heat intolerance and polyuria.  Genitourinary: Negative for dyspareunia, dysuria, flank pain, frequency, genital sores, hematuria, menstrual problem, pelvic pain, urgency, vaginal bleeding, vaginal discharge and vaginal pain.  Musculoskeletal: Negative for back pain, joint swelling and myalgias.  Skin: Negative for rash.  Neurological: Negative for dizziness, syncope, light-headedness, numbness and headaches.  Hematological: Negative for  adenopathy.  Psychiatric/Behavioral: Negative for agitation, confusion, sleep disturbance and suicidal ideas. The patient is not nervous/anxious.      Objective: BP 120/78   Ht 5\' 5"  (1.651 m)   Wt 152 lb (68.9 kg)   BMI 25.29 kg/m    Physical Exam Constitutional:      Appearance: She is well-developed.  Genitourinary:     Vulva, vagina, uterus, right adnexa and left adnexa normal.     No vulval lesion or tenderness noted.     No vaginal discharge, erythema or tenderness.     No cervical motion tenderness or polyp.     Uterus is not enlarged or tender.     No right or left adnexal mass present.     Right adnexa not tender.     Left adnexa not tender.  Neck:     Musculoskeletal: Normal range of motion.     Thyroid: No thyromegaly.  Cardiovascular:     Rate and Rhythm: Normal rate and regular rhythm.     Heart sounds: Normal heart sounds. No murmur.  Pulmonary:     Effort: Pulmonary effort is normal.     Breath sounds: Normal breath sounds.  Chest:     Breasts:        Right: No mass, nipple discharge, skin change or tenderness.        Left: No mass, nipple discharge, skin change or tenderness.  Abdominal:     Palpations: Abdomen is soft.     Tenderness: There is no abdominal tenderness. There is no guarding.  Musculoskeletal: Normal range of motion.  Neurological:     General: No focal deficit present.     Mental Status: She is alert and oriented to person, place, and time.     Cranial Nerves: No cranial nerve deficit.  Skin:    General: Skin is warm and dry.  Psychiatric:        Mood and Affect: Mood normal.        Behavior: Behavior normal.        Thought Content: Thought content normal.        Judgment: Judgment normal.  Vitals signs reviewed.     Assessment/Plan: Encounter for annual routine gynecological examination -   Cervical cancer screening - Plan: Cytology - PAP  Screening for HPV (human papillomavirus) - Plan: Cytology - PAP,   Cervical high  risk human papillomavirus (HPV) DNA test positive - Plan: Cytology - PAP, Will call with results.   Encounter for surveillance of injectable contraceptive - Plan: medroxyPROGESTERone Acetate 150 MG/ML SUSY, Depo RF. Cont ca/add Vit D3.   Meds ordered this encounter  Medications  . medroxyPROGESTERone Acetate 150 MG/ML SUSY    Sig: Inject 1 mL (150 mg total) into the muscle every 3 (three)  months.    Dispense:  1 mL    Refill:  3    Order Specific Question:   Supervising Provider    Answer:   Nadara MustardHARRIS, ROBERT P [[id number]]             GYN counsel adequate intake of calcium and vitamin D, diet and exercise     F/U  Return in about 1 year (around 04/16/2020).  Ifrah Vest B. Vickye Astorino, PA-C 04/17/2019 4:33 PM

## 2019-04-20 ENCOUNTER — Encounter: Payer: Self-pay | Admitting: Obstetrics and Gynecology

## 2019-04-21 LAB — CYTOLOGY - PAP
Diagnosis: UNDETERMINED — AB
HPV 16/18/45 genotyping: NEGATIVE
HPV: DETECTED — AB

## 2019-05-06 ENCOUNTER — Ambulatory Visit (INDEPENDENT_AMBULATORY_CARE_PROVIDER_SITE_OTHER): Payer: BC Managed Care – PPO

## 2019-05-06 ENCOUNTER — Other Ambulatory Visit: Payer: Self-pay

## 2019-05-06 DIAGNOSIS — Z3042 Encounter for surveillance of injectable contraceptive: Secondary | ICD-10-CM | POA: Diagnosis not present

## 2019-05-06 MED ORDER — MEDROXYPROGESTERONE ACETATE 150 MG/ML IM SUSP
150.0000 mg | Freq: Once | INTRAMUSCULAR | Status: AC
  Administered 2019-05-06: 150 mg via INTRAMUSCULAR

## 2019-05-12 ENCOUNTER — Other Ambulatory Visit (HOSPITAL_COMMUNITY)
Admission: RE | Admit: 2019-05-12 | Discharge: 2019-05-12 | Disposition: A | Discharge status: Home / Self Care | Payer: BC Managed Care – PPO | Source: Ambulatory Visit | Attending: Obstetrics and Gynecology | Admitting: Obstetrics and Gynecology

## 2019-05-12 ENCOUNTER — Ambulatory Visit (INDEPENDENT_AMBULATORY_CARE_PROVIDER_SITE_OTHER): Payer: BC Managed Care – PPO | Admitting: Obstetrics and Gynecology

## 2019-05-12 ENCOUNTER — Encounter: Payer: Self-pay | Admitting: Obstetrics and Gynecology

## 2019-05-12 ENCOUNTER — Other Ambulatory Visit: Payer: Self-pay

## 2019-05-12 VITALS — BP 120/80 | Ht 65.0 in | Wt 152.0 lb

## 2019-05-12 DIAGNOSIS — R87810 Cervical high risk human papillomavirus (HPV) DNA test positive: Secondary | ICD-10-CM

## 2019-05-12 DIAGNOSIS — R87610 Atypical squamous cells of undetermined significance on cytologic smear of cervix (ASC-US): Secondary | ICD-10-CM | POA: Diagnosis not present

## 2019-05-12 DIAGNOSIS — N72 Inflammatory disease of cervix uteri: Secondary | ICD-10-CM | POA: Diagnosis not present

## 2019-05-12 NOTE — Progress Notes (Signed)
   GYNECOLOGY CLINIC COLPOSCOPY PROCEDURE NOTE  33 y.o. G0P0000 here for colposcopy for ASCUS with POSITIVE high risk HPV  pap smear on 04/17/2019. Discussed underlying role for HPV infection in the development of cervical dysplasia, its natural history and progression/regression, need for surveillance.  Is the patient  pregnant: No LMP: No LMP recorded. Patient has had an injection. Smoking status:  reports that she has never smoked. She has never used smokeless tobacco. Contraception: Depo-Provera injections Future fertility desired:  Yes  Patient given informed consent, signed copy in the chart, time out was performed.  The patient was position in dorsal lithotomy position. Speculum was placed the cervix was visualized.   After application of acetic acid colposcopic inspection of the cervix was undertaken.   Colposcopy adequate, full visualization of transformation zone: Yes HPV changes noted at 6 o'clock; corresponding biopsies obtained.   ECC specimen obtained:  Yes  All specimens were labeled and sent to pathology.   Patient was given post procedure instructions.  Will follow up pathology and manage accordingly.  Routine preventative health maintenance measures emphasized.  Physical Exam Genitourinary:        Adrian Prows MD [facility] 05/12/2019 3:56 PM

## 2019-05-22 ENCOUNTER — Telehealth: Payer: Self-pay

## 2019-05-22 NOTE — Telephone Encounter (Signed)
Pt calling for colpo results.  [phone]

## 2019-05-22 NOTE — Telephone Encounter (Signed)
Please advise, I have informed pt via MyChart that you are out of the office and will return on Monday 05/25/19

## 2019-05-25 NOTE — Progress Notes (Signed)
Discussed with patient. Repeat pap smear in 1 year with cotesting

## 2019-07-17 DIAGNOSIS — Z3202 Encounter for pregnancy test, result negative: Secondary | ICD-10-CM | POA: Diagnosis not present

## 2019-07-17 DIAGNOSIS — L700 Acne vulgaris: Secondary | ICD-10-CM | POA: Diagnosis not present

## 2019-07-28 ENCOUNTER — Other Ambulatory Visit: Payer: Self-pay

## 2019-07-28 ENCOUNTER — Ambulatory Visit (INDEPENDENT_AMBULATORY_CARE_PROVIDER_SITE_OTHER): Payer: BC Managed Care – PPO

## 2019-07-28 DIAGNOSIS — Z3042 Encounter for surveillance of injectable contraceptive: Secondary | ICD-10-CM | POA: Diagnosis not present

## 2019-07-28 MED ORDER — MEDROXYPROGESTERONE ACETATE 150 MG/ML IM SUSP
150.0000 mg | Freq: Once | INTRAMUSCULAR | Status: AC
  Administered 2019-07-28: 150 mg via INTRAMUSCULAR

## 2019-07-29 ENCOUNTER — Ambulatory Visit: Payer: BC Managed Care – PPO

## 2019-08-14 DIAGNOSIS — L700 Acne vulgaris: Secondary | ICD-10-CM | POA: Diagnosis not present

## 2019-08-20 DIAGNOSIS — Z79899 Other long term (current) drug therapy: Secondary | ICD-10-CM | POA: Diagnosis not present

## 2019-08-20 DIAGNOSIS — L700 Acne vulgaris: Secondary | ICD-10-CM | POA: Diagnosis not present

## 2019-10-20 ENCOUNTER — Other Ambulatory Visit: Payer: Self-pay

## 2019-10-20 ENCOUNTER — Ambulatory Visit (INDEPENDENT_AMBULATORY_CARE_PROVIDER_SITE_OTHER): Payer: BLUE CROSS/BLUE SHIELD

## 2019-10-20 DIAGNOSIS — Z3042 Encounter for surveillance of injectable contraceptive: Secondary | ICD-10-CM

## 2019-10-20 MED ORDER — MEDROXYPROGESTERONE ACETATE 150 MG/ML IM SUSP
150.0000 mg | Freq: Once | INTRAMUSCULAR | Status: AC
  Administered 2019-10-20: 150 mg via INTRAMUSCULAR

## 2019-11-20 DIAGNOSIS — F329 Major depressive disorder, single episode, unspecified: Secondary | ICD-10-CM | POA: Diagnosis not present

## 2019-11-20 DIAGNOSIS — L271 Localized skin eruption due to drugs and medicaments taken internally: Secondary | ICD-10-CM | POA: Diagnosis not present

## 2019-11-20 DIAGNOSIS — L700 Acne vulgaris: Secondary | ICD-10-CM | POA: Diagnosis not present

## 2019-11-20 DIAGNOSIS — R040 Epistaxis: Secondary | ICD-10-CM | POA: Diagnosis not present

## 2019-11-24 DIAGNOSIS — L700 Acne vulgaris: Secondary | ICD-10-CM | POA: Diagnosis not present

## 2019-12-09 ENCOUNTER — Encounter: Payer: Self-pay | Admitting: Obstetrics and Gynecology

## 2019-12-13 IMAGING — CR DG ABDOMEN 1V
2 series · 2 of 2 positions shown · non-contrast
Comparison: CT 10/22/2017

CLINICAL DATA: Right flank pain.  Evaluate for stone.

EXAM:
ABDOMEN - 1 VIEW

[abdomen kub (1 of 2)]
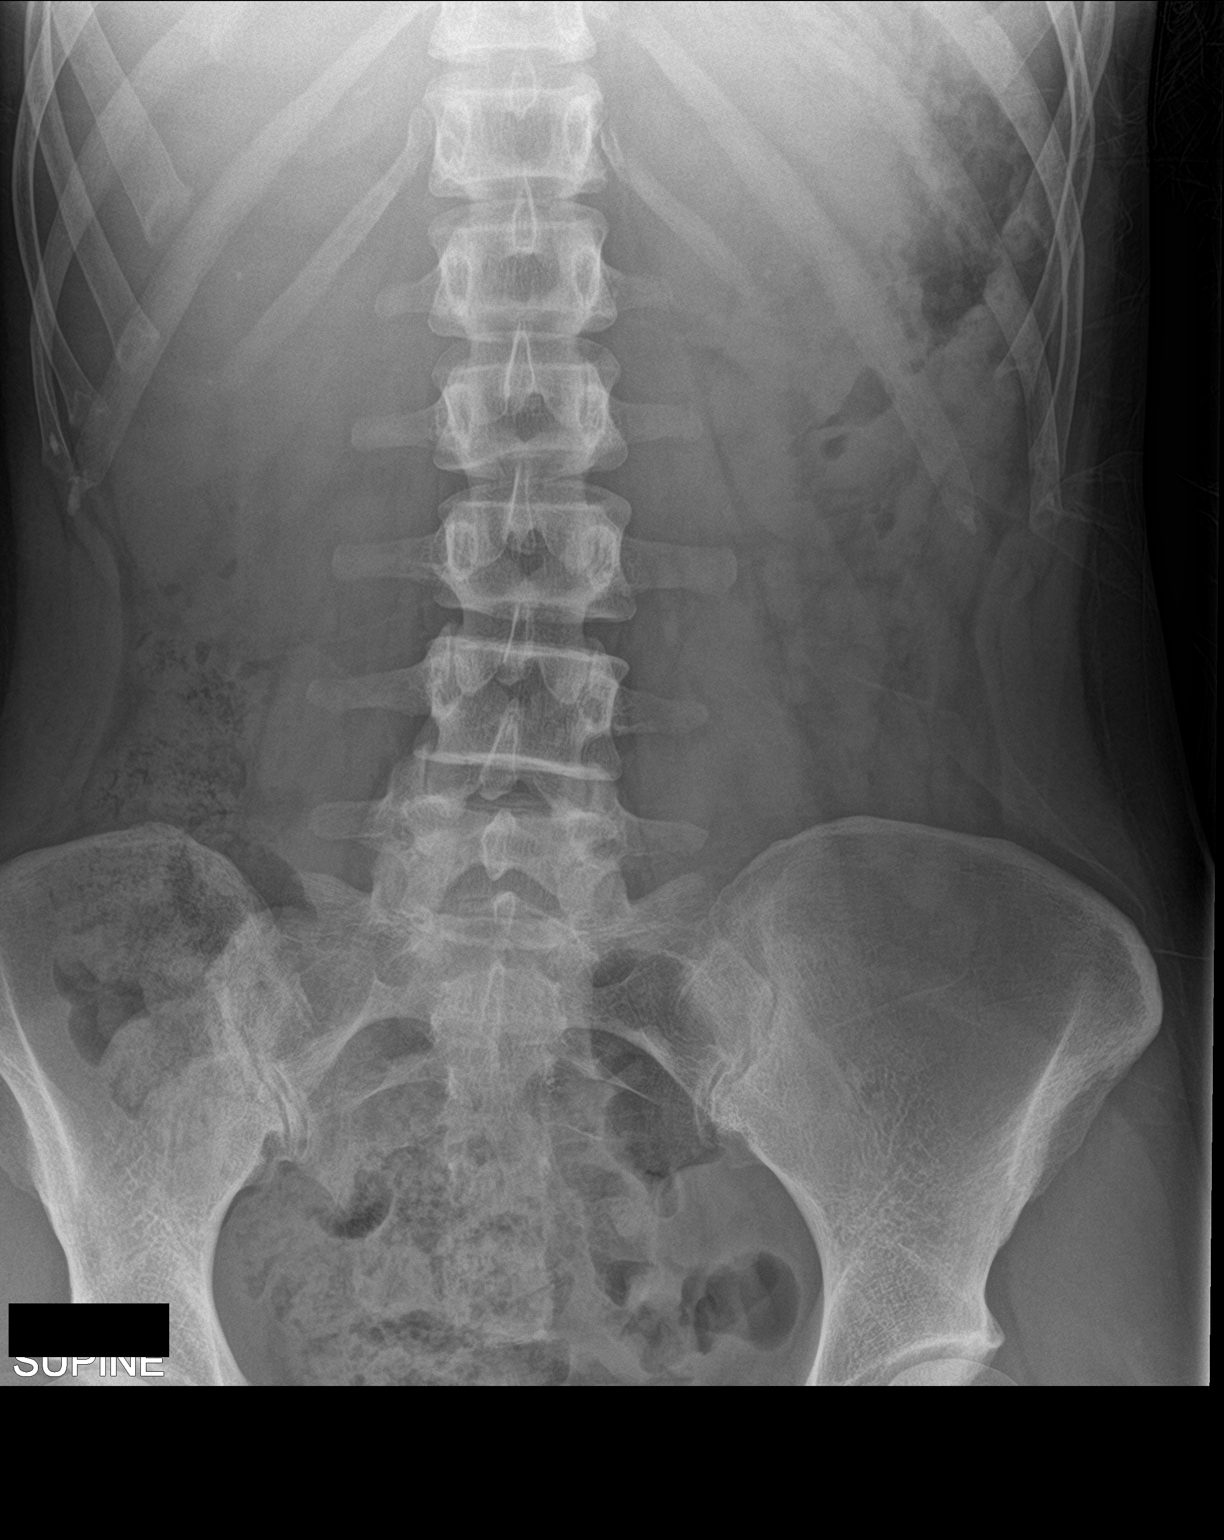

[abdomen kub (2 of 2)]
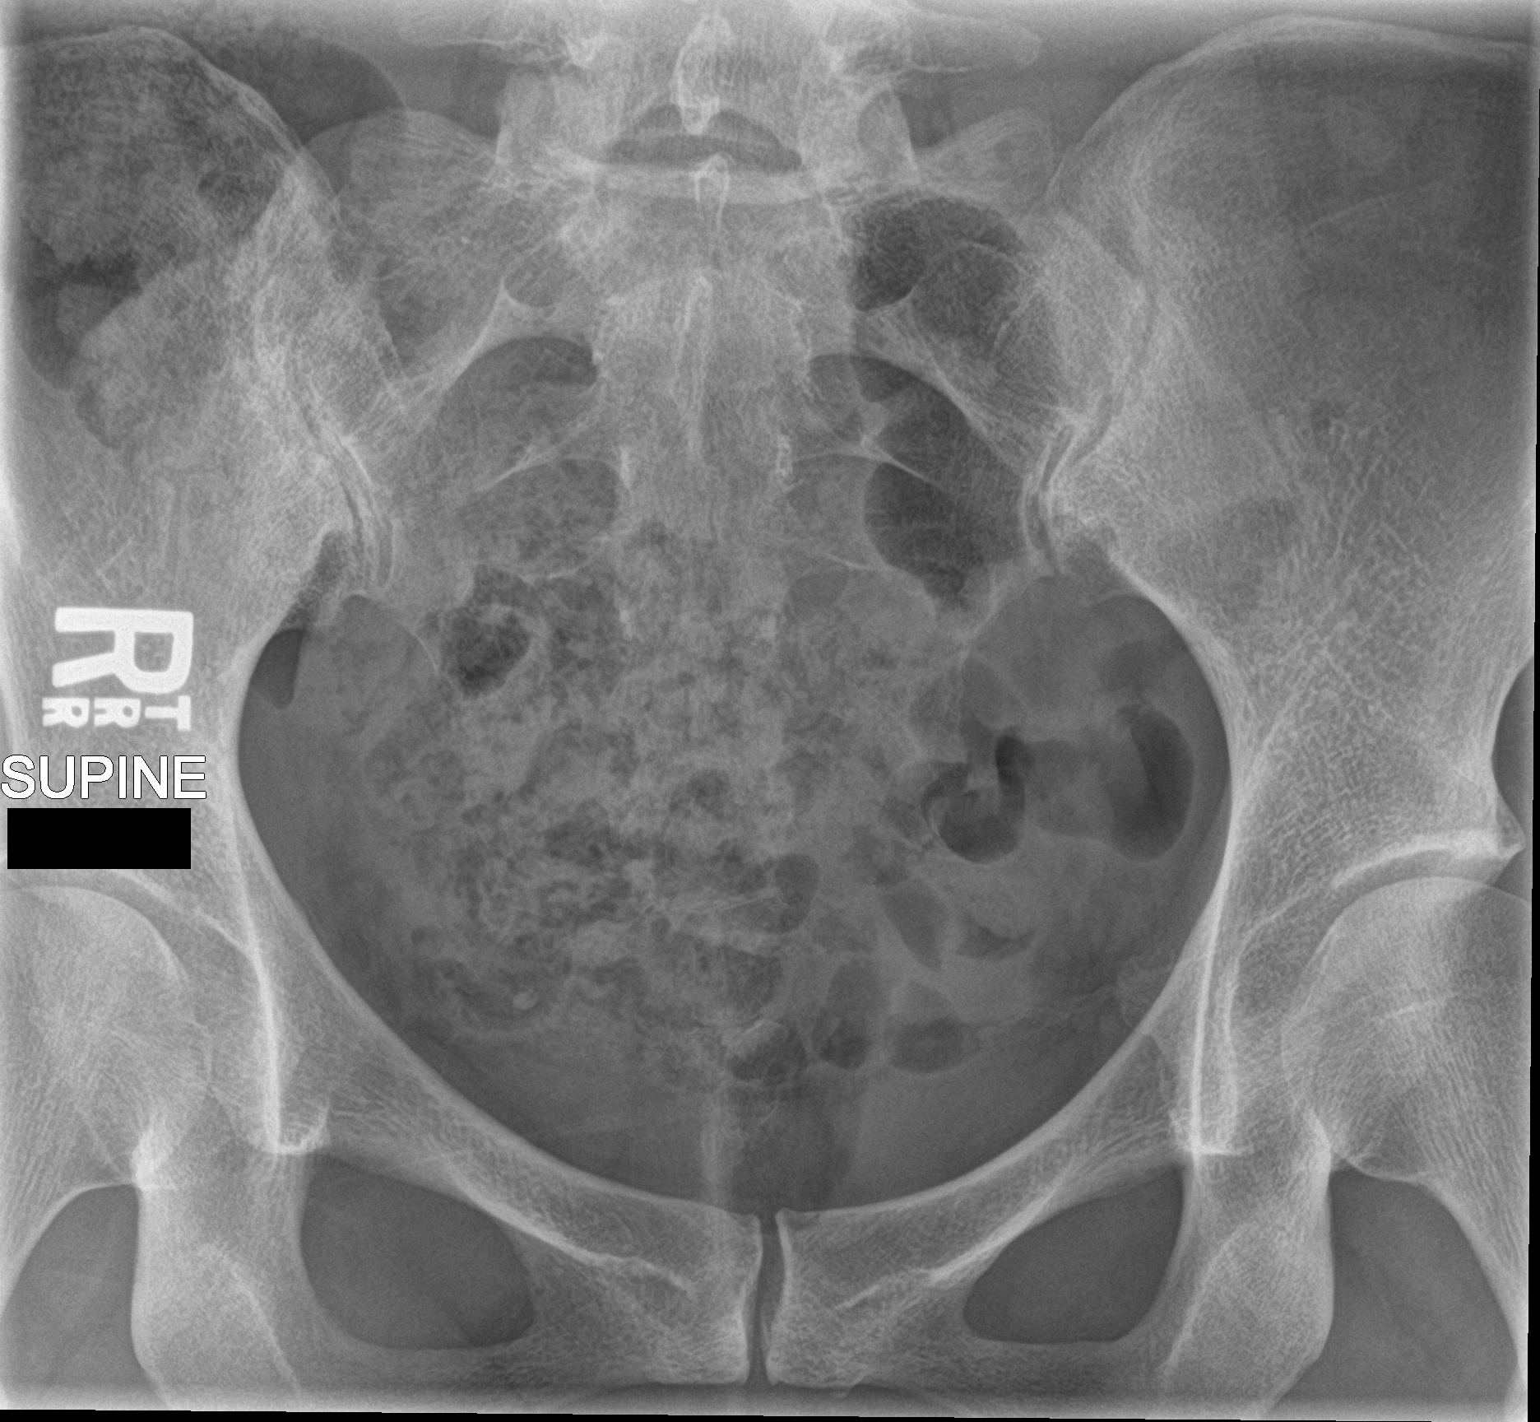

[2 of 2 positions shown; findings below may reference images not displayed]

FINDINGS: Small bilateral intrarenal calculi. 4 mm right pelvic calcification
may be within the distal ureter. No bowel dilatation to suggest
obstruction. Moderate stool in the proximal colon. Minimal stool
distally. No evidence of free air.
IMPRESSION: Bilateral intrarenal calculi. A 4 mm right pelvic calcification may
be within the distal ureter.

## 2019-12-15 NOTE — Telephone Encounter (Signed)
Pls leave pt 2 samples Slynd at front desk with coupon card. Pls notify her when they are ready to pick up. Thx

## 2019-12-24 DIAGNOSIS — L700 Acne vulgaris: Secondary | ICD-10-CM | POA: Diagnosis not present

## 2020-01-12 ENCOUNTER — Ambulatory Visit: Payer: BLUE CROSS/BLUE SHIELD

## 2020-01-28 DIAGNOSIS — L700 Acne vulgaris: Secondary | ICD-10-CM | POA: Diagnosis not present

## 2020-02-22 ENCOUNTER — Encounter: Payer: Self-pay | Admitting: Obstetrics and Gynecology

## 2020-02-22 ENCOUNTER — Ambulatory Visit (INDEPENDENT_AMBULATORY_CARE_PROVIDER_SITE_OTHER): Payer: BLUE CROSS/BLUE SHIELD | Admitting: Obstetrics and Gynecology

## 2020-02-22 ENCOUNTER — Other Ambulatory Visit: Payer: Self-pay

## 2020-02-22 VITALS — BP 116/74 | Ht 65.0 in | Wt 162.0 lb

## 2020-02-22 DIAGNOSIS — Z3041 Encounter for surveillance of contraceptive pills: Secondary | ICD-10-CM | POA: Diagnosis not present

## 2020-02-22 DIAGNOSIS — R399 Unspecified symptoms and signs involving the genitourinary system: Secondary | ICD-10-CM

## 2020-02-22 LAB — POCT URINALYSIS DIPSTICK
Bilirubin, UA: NEGATIVE
Glucose, UA: NEGATIVE
Ketones, UA: NEGATIVE
Nitrite, UA: NEGATIVE
Protein, UA: NEGATIVE
Spec Grav, UA: 1.01 (ref 1.010–1.025)
pH, UA: 6.5 (ref 5.0–8.0)

## 2020-02-22 MED ORDER — SULFAMETHOXAZOLE-TRIMETHOPRIM 800-160 MG PO TABS: 1.0000 | ORAL_TABLET | Freq: Two times a day (BID) | ORAL | 0 refills | Status: DC

## 2020-02-22 MED ORDER — SLYND 4 MG PO TABS: 1.0000 | ORAL_TABLET | Freq: Every day | ORAL | 0 refills | Status: DC

## 2020-02-22 NOTE — Patient Instructions (Signed)
I value your feedback and entrusting us with your care. If you get a [facility] patient survey, I would appreciate you taking the time to let us know about your experience today. Thank you!  As of September 10, 2019, your lab results will be released to your MyChart immediately, before I even have a chance to see them. Please give me time to review them and contact you if there are any abnormalities. Thank you for your patience.  

## 2020-02-22 NOTE — Progress Notes (Signed)
Kayla Townsend, Kayla Evener, PA-C   Chief Complaint  Patient presents with  . Urinary Tract Infection    frequency urinating, no burning since yesterday    HPI:      [name] is a 34 y.o. G0P0000 whose LMP was No LMP recorded. (Menstrual status: Irregular Periods)., presents today for UTI sx of urinary frequency and urgency with urin odor since yesterday. No dysuria, hematuria, LBP, pelvic pain, fevers, vag sx, or vag bleeding. Taking AZO.  Hx of proteus on 10/19 C&S, resistant to macrobid.  Pt also changed from depo to slynd. Doing well and likes the pills. Wants RF. Annual due 7/21.   Past Medical History:  Diagnosis Date  . Cervical high risk human papillomavirus (HPV) DNA test positive 03/2018  . Cholelithiases 03/2018   on renal u/s, no sx  . Kidney stone     History reviewed. No pertinent surgical history.  Family History  Problem Relation Age of Onset  . Diabetes Maternal Grandfather   . Lung cancer Paternal Grandfather   . Stroke Father   . Dementia Father   . Breast cancer Neg Hx   . Ovarian cancer Neg Hx     Social History   Socioeconomic History  . Marital status: Single    Spouse name: Not on file  . Number of children: Not on file  . Years of education: Not on file  . Highest education level: Not on file  Occupational History  . Not on file  Tobacco Use  . Smoking status: Never Smoker  . Smokeless tobacco: Never Used  Substance and Sexual Activity  . Alcohol use: No  . Drug use: No  . Sexual activity: Yes    Birth control/protection: Pill  Other Topics Concern  . Not on file  Social History Narrative  . Not on file   Social Determinants of Health   Financial Resource Strain:   . Difficulty of Paying Living Expenses:   Food Insecurity:   . Worried About Charity fundraiser in the Last Year:   . Arboriculturist in the Last Year:   Transportation Needs:   . Film/video editor (Medical):   Marland Kitchen Lack of Transportation (Non-Medical):     Physical Activity:   . Days of Exercise per Week:   . Minutes of Exercise per Session:   Stress:   . Feeling of Stress :   Social Connections:   . Frequency of Communication with Friends and Family:   . Frequency of Social Gatherings with Friends and Family:   . Attends Religious Services:   . Active Member of Clubs or Organizations:   . Attends Archivist Meetings:   Marland Kitchen Marital Status:   Intimate Partner Violence:   . Fear of Current or Ex-Partner:   . Emotionally Abused:   Marland Kitchen Physically Abused:   . Sexually Abused:     Outpatient Medications Prior to Visit  Medication Sig Dispense Refill  . medroxyPROGESTERone Acetate 150 MG/ML SUSY Inject 1 mL (150 mg total) into the muscle every 3 (three) months. 1 mL 3  . tretinoin (RETIN-A) 0.025 % cream APPLY A PEA SIZED AMOUNT TO DRY FACE AND BACK AT BEDTIME.     No facility-administered medications prior to visit.      ROS:  Review of Systems  Constitutional: Negative for fever.  Gastrointestinal: Negative for blood in stool, constipation, diarrhea, nausea and vomiting.  Genitourinary: Positive for frequency and urgency. Negative for dyspareunia, dysuria,  flank pain, hematuria, vaginal bleeding, vaginal discharge and vaginal pain.  Musculoskeletal: Negative for back pain.  Skin: Negative for rash.  BREAST: No symptoms   OBJECTIVE:   Vitals:  BP 116/74   Ht 5\' 5"  (1.651 m)   Wt 162 lb (73.5 kg)   BMI 26.96 kg/m   Physical Exam Vitals reviewed.  Constitutional:      Appearance: She is well-developed. She is not ill-appearing or toxic-appearing.  Pulmonary:     Effort: Pulmonary effort is normal.  Abdominal:     Tenderness: There is no right CVA tenderness or left CVA tenderness.  Musculoskeletal:        General: Normal range of motion.     Cervical back: Normal range of motion.  Neurological:     General: No focal deficit present.     Mental Status: She is alert and oriented to person, place, and time.      Cranial Nerves: No cranial nerve deficit.  Psychiatric:        Behavior: Behavior normal.        Thought Content: Thought content normal.        Judgment: Judgment normal.     Results: Results for orders placed or performed in visit on 02/22/20 (from the past 24 hour(s))  POCT Urinalysis Dipstick     Status: Abnormal   Collection Time: 02/22/20 11:21 AM  Result Value Ref Range   Color, UA yellow    Clarity, UA clear    Glucose, UA Negative Negative   Bilirubin, UA neg    Ketones, UA neg    Spec Grav, UA 1.010 1.010 - 1.025   Blood, UA trace    pH, UA 6.5 5.0 - 8.0   Protein, UA Negative Negative   Urobilinogen, UA     Nitrite, UA neg    Leukocytes, UA Small (1+) (A) Negative   Appearance     Odor       Assessment/Plan: UTI symptoms - Plan: sulfamethoxazole-trimethoprim (BACTRIM DS) 800-160 MG tablet, POCT Urinalysis Dipstick, POCT urine pregnancy; Pos sx and UA. Rx bactrim. Check C&S. F/u prn.   Encounter for surveillance of contraceptive pills - Plan: Drospirenone (SLYND) 4 MG TABS; POP RF   Meds ordered this encounter  Medications  . sulfamethoxazole-trimethoprim (BACTRIM DS) 800-160 MG tablet    Sig: Take 1 tablet by mouth 2 (two) times daily for 7 days.    Dispense:  14 tablet    Refill:  0    Order Specific Question:   Supervising Provider    Answer:   02/24/20 Nadara Mustard  . Drospirenone (SLYND) 4 MG TABS    Sig: Take 1 tablet by mouth daily.    Dispense:  84 tablet    Refill:  0    Order Specific Question:   Supervising Provider    Answer:   [id number] Nadara Mustard      Return if symptoms worsen or fail to improve.  Sierria Bruney B. Cherree Conerly, PA-C 02/22/2020 11:22 AM

## 2020-02-24 ENCOUNTER — Other Ambulatory Visit: Payer: Self-pay | Admitting: Obstetrics and Gynecology

## 2020-02-24 LAB — URINE CULTURE

## 2020-02-24 MED ORDER — NITROFURANTOIN MONOHYD MACRO 100 MG PO CAPS: 100.0000 mg | ORAL_CAPSULE | Freq: Two times a day (BID) | ORAL | 0 refills | Status: AC

## 2020-04-21 ENCOUNTER — Other Ambulatory Visit (HOSPITAL_COMMUNITY)
Admission: RE | Admit: 2020-04-21 | Discharge: 2020-04-21 | Disposition: A | Discharge status: Home / Self Care | Payer: BC Managed Care – PPO | Source: Ambulatory Visit | Attending: Obstetrics and Gynecology | Admitting: Obstetrics and Gynecology

## 2020-04-21 ENCOUNTER — Ambulatory Visit (INDEPENDENT_AMBULATORY_CARE_PROVIDER_SITE_OTHER): Payer: BLUE CROSS/BLUE SHIELD | Admitting: Obstetrics and Gynecology

## 2020-04-21 ENCOUNTER — Other Ambulatory Visit: Payer: Self-pay

## 2020-04-21 ENCOUNTER — Encounter: Payer: Self-pay | Admitting: Obstetrics and Gynecology

## 2020-04-21 VITALS — BP 110/80 | Ht 65.0 in | Wt 160.0 lb

## 2020-04-21 DIAGNOSIS — Z124 Encounter for screening for malignant neoplasm of cervix: Secondary | ICD-10-CM | POA: Diagnosis not present

## 2020-04-21 DIAGNOSIS — Z3041 Encounter for surveillance of contraceptive pills: Secondary | ICD-10-CM

## 2020-04-21 DIAGNOSIS — R87810 Cervical high risk human papillomavirus (HPV) DNA test positive: Secondary | ICD-10-CM

## 2020-04-21 DIAGNOSIS — Z1151 Encounter for screening for human papillomavirus (HPV): Secondary | ICD-10-CM | POA: Diagnosis not present

## 2020-04-21 DIAGNOSIS — R87610 Atypical squamous cells of undetermined significance on cytologic smear of cervix (ASC-US): Secondary | ICD-10-CM | POA: Insufficient documentation

## 2020-04-21 DIAGNOSIS — Z01419 Encounter for gynecological examination (general) (routine) without abnormal findings: Secondary | ICD-10-CM

## 2020-04-21 MED ORDER — SLYND 4 MG PO TABS: 1.0000 | ORAL_TABLET | Freq: Every day | ORAL | 3 refills | Status: DC

## 2020-04-21 NOTE — Patient Instructions (Signed)
I value your feedback and entrusting us with your care. If you get a [facility] patient survey, I would appreciate you taking the time to let us know about your experience today. Thank you!  As of September 10, 2019, your lab results will be released to your MyChart immediately, before I even have a chance to see them. Please give me time to review them and contact you if there are any abnormalities. Thank you for your patience.  

## 2020-04-21 NOTE — Progress Notes (Signed)
Chief Complaint  Patient presents with  . Gynecologic Exam     HPI:      Ms. IVIE SAVITT is a 34 y.o. G0P0000 who LMP was No LMP recorded. (Menstrual status: Oral contraceptives)., presents today for her annual examination. Her menses are absent with POPs, changed from depo 3/21 in order to conceive in future. No dysmen, no BTB.  Hx of headaches with estrogen containing OCPs, including sprintec and aviane; no issues with POPs.  Didn't like IUD in the past.  Sex activity: sexually active--contraception POPs Last Pap: 04/17/19 Results were: ASCUS/ POS HPV DNA, 2019 with neg pap/POS HPV DNA. Had colpo 05/12/19 with Dr. Jerene Pitch with neg path.  Repeat due this yr Hx of STDs: HPV  There is no FH of breast cancer. There is no FH of ovarian cancer. The patient does do self-breast exams.  Tobacco use: The patient denies current or previous tobacco use. Alcohol use: none Drug use: none Exercise: very active  She does get adequate calcium and Vitamin D in her diet.  UTI sx from 5/21 resolved after abx tx.  Past Medical History:  Diagnosis Date  . Cervical high risk human papillomavirus (HPV) DNA test positive 03/2018  . Cholelithiases 03/2018   on renal u/s, no sx  . Kidney stone     History reviewed. No pertinent surgical history.  Family History  Problem Relation Age of Onset  . Diabetes Maternal Grandfather   . Lung cancer Paternal Grandfather   . Stroke Father   . Dementia Father   . Breast cancer Neg Hx   . Ovarian cancer Neg Hx     Social History   Socioeconomic History  . Marital status: Single    Spouse name: Not on file  . Number of children: Not on file  . Years of education: Not on file  . Highest education level: Not on file  Occupational History  . Not on file  Tobacco Use  . Smoking status: Never Smoker  . Smokeless tobacco: Never Used  Vaping Use  . Vaping Use: Never used  Substance and Sexual Activity  . Alcohol use: No  . Drug use: No    . Sexual activity: Yes    Birth control/protection: Pill  Other Topics Concern  . Not on file  Social History Narrative  . Not on file   Social Determinants of Health   Financial Resource Strain:   . Difficulty of Paying Living Expenses:   Food Insecurity:   . Worried About Programme researcher, broadcasting/film/video in the Last Year:   . Barista in the Last Year:   Transportation Needs:   . Freight forwarder (Medical):   Marland Kitchen Lack of Transportation (Non-Medical):   Physical Activity:   . Days of Exercise per Week:   . Minutes of Exercise per Session:   Stress:   . Feeling of Stress :   Social Connections:   . Frequency of Communication with Friends and Family:   . Frequency of Social Gatherings with Friends and Family:   . Attends Religious Services:   . Active Member of Clubs or Organizations:   . Attends Banker Meetings:   Marland Kitchen Marital Status:   Intimate Partner Violence:   . Fear of Current or Ex-Partner:   . Emotionally Abused:   Marland Kitchen Physically Abused:   . Sexually Abused:     No current outpatient medications on file prior to visit.   No current facility-administered medications  on file prior to visit.      ROS:  Review of Systems  Constitutional: Negative for fatigue, fever and unexpected weight change.  Respiratory: Negative for cough, shortness of breath and wheezing.   Cardiovascular: Negative for chest pain, palpitations and leg swelling.  Gastrointestinal: Negative for blood in stool, constipation, diarrhea, nausea and vomiting.  Endocrine: Negative for cold intolerance, heat intolerance and polyuria.  Genitourinary: Negative for dyspareunia, dysuria, flank pain, frequency, genital sores, hematuria, menstrual problem, pelvic pain, urgency, vaginal bleeding, vaginal discharge and vaginal pain.  Musculoskeletal: Negative for back pain, joint swelling and myalgias.  Skin: Negative for rash.  Neurological: Negative for dizziness, syncope, light-headedness,  numbness and headaches.  Hematological: Negative for adenopathy.  Psychiatric/Behavioral: Negative for agitation, confusion, sleep disturbance and suicidal ideas. The patient is not nervous/anxious.      Objective: BP 110/80   Ht 5\' 5"  (1.651 m)   Wt 160 lb (72.6 kg)   BMI 26.63 kg/m    Physical Exam Constitutional:      Appearance: She is well-developed.  Genitourinary:     Vulva, vagina, uterus, right adnexa and left adnexa normal.     No vulval lesion or tenderness noted.     No vaginal discharge, erythema or tenderness.     No cervical motion tenderness or polyp.     Uterus is not enlarged or tender.     No right or left adnexal mass present.     Right adnexa not tender.     Left adnexa not tender.  Neck:     Thyroid: No thyromegaly.  Cardiovascular:     Rate and Rhythm: Normal rate and regular rhythm.     Heart sounds: Normal heart sounds. No murmur heard.   Pulmonary:     Effort: Pulmonary effort is normal.     Breath sounds: Normal breath sounds.  Chest:     Breasts:        Right: No mass, nipple discharge, skin change or tenderness.        Left: No mass, nipple discharge, skin change or tenderness.  Abdominal:     Palpations: Abdomen is soft.     Tenderness: There is no abdominal tenderness. There is no guarding.  Musculoskeletal:        General: Normal range of motion.     Cervical back: Normal range of motion.  Neurological:     General: No focal deficit present.     Mental Status: She is alert and oriented to person, place, and time.     Cranial Nerves: No cranial nerve deficit.  Skin:    General: Skin is warm and dry.  Psychiatric:        Mood and Affect: Mood normal.        Behavior: Behavior normal.        Thought Content: Thought content normal.        Judgment: Judgment normal.  Vitals reviewed.     Assessment/Plan: Encounter for annual routine gynecological examination  Cervical cancer screening - Plan: Cytology - PAP  Screening for  HPV (human papillomavirus) - Plan: Cytology - PAP  ASCUS with positive high risk HPV cervical - Plan: Cytology - PAP; Repeat today, will call with results and f/u  Encounter for surveillance of contraceptive pills - Plan: Drospirenone (SLYND) 4 MG TABS; Rx RF. Coupon card. F/u prn.    Meds ordered this encounter  Medications  . Drospirenone (SLYND) 4 MG TABS    Sig: Take 1 tablet by  mouth daily.    Dispense:  84 tablet    Refill:  3    Order Specific Question:   Supervising Provider    Answer:   Nadara Mustard [[id number]]             GYN counsel adequate intake of calcium and vitamin D, diet and exercise     F/U  Return in about 1 year (around 04/21/2021).  Salimah Martinovich B. Latina Frank, PA-C 04/21/2020 11:28 AM

## 2020-04-25 LAB — CYTOLOGY - PAP
Comment: NEGATIVE
Diagnosis: NEGATIVE
High risk HPV: NEGATIVE

## 2020-05-19 ENCOUNTER — Encounter: Payer: Self-pay | Admitting: Family Medicine

## 2020-05-19 ENCOUNTER — Other Ambulatory Visit: Payer: Self-pay

## 2020-05-19 ENCOUNTER — Ambulatory Visit
Admission: RE | Admit: 2020-05-19 | Discharge: 2020-05-19 | Disposition: A | Discharge status: Home / Self Care | Payer: BLUE CROSS/BLUE SHIELD | Source: Ambulatory Visit | Attending: Family Medicine | Admitting: Family Medicine

## 2020-05-19 ENCOUNTER — Ambulatory Visit (INDEPENDENT_AMBULATORY_CARE_PROVIDER_SITE_OTHER): Payer: BLUE CROSS/BLUE SHIELD | Admitting: Family Medicine

## 2020-05-19 VITALS — BP 115/54 | HR 100 | Temp 98.5°F | Resp 17 | Ht 64.25 in | Wt 163.2 lb

## 2020-05-19 DIAGNOSIS — Z8659 Personal history of other mental and behavioral disorders: Secondary | ICD-10-CM | POA: Insufficient documentation

## 2020-05-19 DIAGNOSIS — E785 Hyperlipidemia, unspecified: Secondary | ICD-10-CM | POA: Diagnosis not present

## 2020-05-19 DIAGNOSIS — Z7689 Persons encountering health services in other specified circumstances: Secondary | ICD-10-CM | POA: Diagnosis not present

## 2020-05-19 DIAGNOSIS — F411 Generalized anxiety disorder: Secondary | ICD-10-CM | POA: Insufficient documentation

## 2020-05-19 DIAGNOSIS — R635 Abnormal weight gain: Secondary | ICD-10-CM | POA: Diagnosis not present

## 2020-05-19 DIAGNOSIS — S99921A Unspecified injury of right foot, initial encounter: Secondary | ICD-10-CM | POA: Insufficient documentation

## 2020-05-19 DIAGNOSIS — Z8342 Family history of familial hypercholesterolemia: Secondary | ICD-10-CM | POA: Diagnosis not present

## 2020-05-19 DIAGNOSIS — F429 Obsessive-compulsive disorder, unspecified: Secondary | ICD-10-CM | POA: Insufficient documentation

## 2020-05-19 LAB — COMPLETE METABOLIC PANEL WITH GFR
AG Ratio: 2.1 (calc) (ref 1.0–2.5)
ALT: 28 U/L (ref 6–29)
AST: 26 U/L (ref 10–30)
Albumin: 3.7 g/dL (ref 3.6–5.1)
Alkaline phosphatase (APISO): 66 U/L (ref 31–125)
BUN: 20 mg/dL (ref 7–25)
CO2: 25 mmol/L (ref 20–32)
Calcium: 9.3 mg/dL (ref 8.6–10.2)
Chloride: 106 mmol/L (ref 98–110)
Creat: 0.78 mg/dL (ref 0.50–1.10)
GFR, Est African American: 117 mL/min/{1.73_m2} (ref >=60)
GFR, Est Non African American: 101 mL/min/{1.73_m2} (ref >=60)
Globulin: 1.8 g/dL (calc) — ABNORMAL LOW (ref 1.9–3.7)
Glucose, Bld: 94 mg/dL (ref 65–99)
Potassium: 4 mmol/L (ref 3.5–5.3)
Sodium: 140 mmol/L (ref 135–146)
Total Bilirubin: 0.5 mg/dL (ref 0.2–1.2)
Total Protein: 5.5 g/dL — ABNORMAL LOW (ref 6.1–8.1)

## 2020-05-19 LAB — LIPID PANEL
Cholesterol: 176 mg/dL (ref <200)
HDL: 72 mg/dL (ref >=50)
LDL Cholesterol (Calc): 89 mg/dL (calc)
Non-HDL Cholesterol (Calc): 104 mg/dL (calc) (ref <130)
Total CHOL/HDL Ratio: 2.4 (calc) (ref <5.0)
Triglycerides: 65 mg/dL (ref <150)

## 2020-05-19 LAB — CBC WITH DIFFERENTIAL/PLATELET
Absolute Monocytes: 595 cells/uL (ref 200–950)
Basophils Absolute: 28 cells/uL (ref 0–200)
Basophils Relative: 0.4 %
Eosinophils Absolute: 126 cells/uL (ref 15–500)
Eosinophils Relative: 1.8 %
HCT: 44.7 % (ref 35.0–45.0)
Hemoglobin: 15.4 g/dL (ref 11.7–15.5)
Lymphs Abs: 2359 cells/uL (ref 850–3900)
MCH: 32.2 pg (ref 27.0–33.0)
MCHC: 34.5 g/dL (ref 32.0–36.0)
MCV: 93.5 fL (ref 80.0–100.0)
MPV: 9.1 fL (ref 7.5–12.5)
Monocytes Relative: 8.5 %
Neutro Abs: 3892 cells/uL (ref 1500–7800)
Neutrophils Relative %: 55.6 %
Platelets: 228 10*3/uL (ref 140–400)
RBC: 4.78 10*6/uL (ref 3.80–5.10)
RDW: 11.8 % (ref 11.0–15.0)
Total Lymphocyte: 33.7 %
WBC: 7 10*3/uL (ref 3.8–10.8)

## 2020-05-19 LAB — POCT URINALYSIS DIPSTICK
Bilirubin, UA: NEGATIVE
Glucose, UA: NEGATIVE
Ketones, UA: NEGATIVE
Leukocytes, UA: NEGATIVE
Nitrite, UA: NEGATIVE
Protein, UA: NEGATIVE
Spec Grav, UA: 1.02 (ref 1.010–1.025)
Urobilinogen, UA: 0.2 E.U./dL
pH, UA: 5 (ref 5.0–8.0)

## 2020-05-19 LAB — THYROID PANEL WITH TSH
Free Thyroxine Index: 2.7 (ref 1.4–3.8)
T3 Uptake: 34 % (ref 22–35)
T4, Total: 8 ug/dL (ref 5.1–11.9)
TSH: 1.19 mIU/L

## 2020-05-19 NOTE — Addendum Note (Signed)
Addended by: Lonna Cobb on: 05/19/2020 01:13 PM   Modules accepted: Orders

## 2020-05-19 NOTE — Assessment & Plan Note (Signed)
New patient establishment at [facility] for primary care.  Has previously followed with an OB/GYN for primary care.  Will order baseline labs for evaluation.  Plan to follow up in 3-6 months for wellness physical.

## 2020-05-19 NOTE — Patient Instructions (Signed)
Have your labs drawn and we will contact you with the results.  Have your right foot xray completed and once we have the report we will contact you to review the results.  We will plan to see you back in 3 months for your wellness physical  You will receive a survey after today's visit either digitally by e-mail or paper by USPS mail. Your experiences and feedback matter to [place].  Please respond so we know how we are doing as we provide care for you.  Call us with any questions/concerns/needs.  It is my goal to be available to you for your health concerns.  Thanks for choosing me to be a partner in your healthcare needs!  Charlaine Dalton, FNP-C Family Nurse Practitioner [facility] Phone: [phone]

## 2020-05-19 NOTE — Assessment & Plan Note (Signed)
Right foot, 2nd digit, with ecchymosis that is beginning to resolve, slight swelling and discomfort with ambulation/palpation.  Likely bruise of bone vs fracture/avulsion.  Discussed can have right foot xray completed and if showing avulsion/fracture that is not aligned, will refer to orthopedics/podiatry.  Patient in agreement with plan.  Plan: 1. Right foot xray ordered

## 2020-05-19 NOTE — Progress Notes (Signed)
Subjective:    Patient ID: Kayla Townsend, female    DOB: 11-18-1985, 34 y.o.   MRN: [id number]  Kayla Townsend is a 34 y.o. female presenting on 05/19/2020 for Establish Care (2nd toe injury on the right foot. Pt hit her toe on the post of her bed x 2 weeks ago.  She notice bruising and discomfort with walking. )   HPI  Previously has followed with her OBGYN for primary care.  Records will not be requested, as are available in Epic.  Past medical, family, and surgical history reviewed w/ pt.  Kayla Townsend has acute concerns today for right foot, 2nd digit, with pain, bruising, swelling and discomfort when walking.  Reports she was making her bed in a hurry and had stubbed her toes on the bedpost.   Depression screen PHQ 2/9 05/19/2020  Decreased Interest 0  Down, Depressed, Hopeless 0  PHQ - 2 Score 0    Social History   Tobacco Use  . Smoking status: Never Smoker  . Smokeless tobacco: Never Used  Vaping Use  . Vaping Use: Never used  Substance Use Topics  . Alcohol use: Yes    Comment: occasional  . Drug use: No    Review of Systems  Constitutional: Negative.   HENT: Negative.   Eyes: Negative.   Respiratory: Negative.   Cardiovascular: Negative.   Gastrointestinal: Negative.   Endocrine: Negative.   Genitourinary: Negative.   Musculoskeletal: Positive for arthralgias and myalgias. Negative for back pain, neck pain and neck stiffness.  Skin: Negative.   Allergic/Immunologic: Negative.   Neurological: Negative.   Hematological: Negative.   Psychiatric/Behavioral: Negative.    Per HPI unless specifically indicated above     Objective:    BP (!) 115/54 (BP Location: Left Arm, Patient Position: Sitting, Cuff Size: Normal)   Pulse 100   Temp 98.5 F (36.9 C) (Oral)   Resp 17   Ht 5' 4.25" (1.632 m)   Wt 163 lb 3.2 oz (74 kg)   LMP 05/19/2020   SpO2 99%   BMI 27.80 kg/m   Wt Readings from Last 3 Encounters:  05/19/20 163 lb 3.2 oz (74 kg)  04/21/20 160  lb (72.6 kg)  02/22/20 162 lb (73.5 kg)    Physical Exam Vitals reviewed.  Constitutional:      General: She is not in acute distress.    Appearance: Normal appearance. She is well-developed, well-groomed and overweight. She is not ill-appearing or toxic-appearing.  HENT:     Head: Normocephalic and atraumatic.     Nose:     Comments: Kayla Townsend is in place, covering mouth and nose. Eyes:     General: Lids are normal. Vision grossly intact.        Right eye: No discharge.        Left eye: No discharge.     Extraocular Movements: Extraocular movements intact.     Conjunctiva/sclera: Conjunctivae normal.     Pupils: Pupils are equal, round, and reactive to light.  Cardiovascular:     Rate and Rhythm: Normal rate and regular rhythm.     Pulses: Normal pulses.          Dorsalis pedis pulses are 2+ on the right side and 2+ on the left side.     Heart sounds: Normal heart sounds. No murmur heard.  No friction rub. No gallop.   Pulmonary:     Effort: Pulmonary effort is normal. No respiratory distress.     Breath  sounds: Normal breath sounds.  Musculoskeletal:        General: Tenderness and signs of injury present.     Right lower leg: No edema.     Left lower leg: No edema.     Right foot: Normal range of motion and normal capillary refill. Swelling and tenderness present. No deformity, foot drop or laceration. Normal pulse.     Left foot: Normal.       Legs:  Skin:    General: Skin is warm and dry.     Capillary Refill: Capillary refill takes less than 2 seconds.  Neurological:     General: No focal deficit present.     Mental Status: She is alert and oriented to person, place, and time.  Psychiatric:        Attention and Perception: Attention and perception normal.        Mood and Affect: Mood and affect normal.        Speech: Speech normal.        Behavior: Behavior normal. Behavior is cooperative.        Thought Content: Thought content normal.        Cognition and Memory:  Cognition and memory normal.        Judgment: Judgment normal.    Results for orders placed or performed in visit on 04/21/20  Cytology - PAP  Result Value Ref Range   High risk HPV Negative    Adequacy      Satisfactory for evaluation; transformation zone component PRESENT.   Diagnosis      - Negative for intraepithelial lesion or malignancy (NILM)   Comment Normal Reference Range HPV - Negative       Assessment & Plan:   Problem List Items Addressed This Visit      Other   Encounter to establish care with new doctor - Primary    New patient establishment at [facility] for primary care.  Has previously followed with an OB/GYN for primary care.  Will order baseline labs for evaluation.  Plan to follow up in 3-6 months for wellness physical.      Relevant Orders   CBC with Differential   COMPLETE METABOLIC PANEL WITH GFR   Lipid Profile   Injury of toe on right foot    Right foot, 2nd digit, with ecchymosis that is beginning to resolve, slight swelling and discomfort with ambulation/palpation.  Likely bruise of bone vs fracture/avulsion.  Discussed can have right foot xray completed and if showing avulsion/fracture that is not aligned, will refer to orthopedics/podiatry.  Patient in agreement with plan.  Plan: 1. Right foot xray ordered      Relevant Orders   DG Foot Complete Right    Other Visit Diagnoses    Weight gain       Relevant Orders   Lipid Profile   Thyroid Panel With TSH      No orders of the defined types were placed in this encounter.     Follow up plan: Return in about 3 months (around 08/19/2020) for CPE.   Charlaine Dalton, FNP Family Nurse Practitioner [facility] 05/19/2020, 12:06 PM

## 2020-10-14 ENCOUNTER — Telehealth: Payer: Self-pay

## 2020-10-14 NOTE — Telephone Encounter (Signed)
Copied from CRM [id number]. Topic: Appointment Scheduling - Scheduling Inquiry for Clinic >> Oct 14, 2020  9:48 AM Wyonia Hough E wrote: Reason for CRM: Pt would like to schedule a CPE in Feb / please advise of new provider schedule

## 2020-10-14 NOTE — Telephone Encounter (Signed)
The pt was notified that Kayla Townsend will be leaving the practice at the end of January. I informed her that we will give her a call and schedule her physical appt with the new provider when the schedule become available. She verbalize understanding, no questions or concerns.

## 2021-01-31 ENCOUNTER — Encounter: Payer: Self-pay | Admitting: Obstetrics and Gynecology

## 2021-01-31 ENCOUNTER — Telehealth: Payer: Self-pay

## 2021-01-31 DIAGNOSIS — Z3041 Encounter for surveillance of contraceptive pills: Secondary | ICD-10-CM

## 2021-01-31 MED ORDER — SLYND 4 MG PO TABS: 1.0000 | ORAL_TABLET | Freq: Every day | ORAL | 0 refills | Status: DC

## 2021-01-31 NOTE — Telephone Encounter (Signed)
Spoke w/pharmacy. They show 2 refills on file. Last fill 01/02/21. I authorized 1 additional refill to get patient to her next AE apt 03/2021

## 2021-01-31 NOTE — Telephone Encounter (Signed)
LMVM to notify patient. 

## 2021-01-31 NOTE — Telephone Encounter (Signed)
Spoke w/pt. She is requesting refill of slynd. Advised full year rx sent 04/21/20 #3 w/3 rf. She has been getting 1 mo supply. I will contact pharmacy.

## 2021-01-31 NOTE — Telephone Encounter (Signed)
Patient needs refill. Unable to submit thru my chart. Med not specified. Cb#[phone]

## 2021-02-08 ENCOUNTER — Encounter: Payer: BLUE CROSS/BLUE SHIELD | Admitting: Internal Medicine

## 2021-02-09 ENCOUNTER — Encounter: Payer: BLUE CROSS/BLUE SHIELD | Admitting: Internal Medicine

## 2021-02-14 ENCOUNTER — Ambulatory Visit (INDEPENDENT_AMBULATORY_CARE_PROVIDER_SITE_OTHER): Payer: BLUE CROSS/BLUE SHIELD | Admitting: Obstetrics and Gynecology

## 2021-02-14 ENCOUNTER — Other Ambulatory Visit: Payer: Self-pay

## 2021-02-14 ENCOUNTER — Encounter: Payer: Self-pay | Admitting: Obstetrics and Gynecology

## 2021-02-14 VITALS — BP 90/70 | Ht 65.0 in | Wt 153.0 lb

## 2021-02-14 DIAGNOSIS — N3001 Acute cystitis with hematuria: Secondary | ICD-10-CM

## 2021-02-14 LAB — POCT URINALYSIS DIPSTICK
Spec Grav, UA: 1.01 (ref 1.010–1.025)
pH, UA: 7.5 (ref 5.0–8.0)

## 2021-02-14 MED ORDER — CIPROFLOXACIN HCL 500 MG PO TABS: 500.0000 mg | ORAL_TABLET | Freq: Two times a day (BID) | ORAL | 0 refills | Status: AC

## 2021-02-14 MED ORDER — FLUCONAZOLE 150 MG PO TABS: 150.0000 mg | ORAL_TABLET | Freq: Once | ORAL | 0 refills | Status: AC

## 2021-02-14 NOTE — Patient Instructions (Signed)
I value your feedback and you entrusting us with your care. If you get a [facility] patient survey, I would appreciate you taking the time to let us know about your experience today. Thank you! ? ? ?

## 2021-02-14 NOTE — Progress Notes (Signed)
Lorre Munroe, NP   Chief Complaint  Patient presents with  . Urinary Tract Infection    Frequency and pain urinating since this a.m.    HPI:      [name] is a 34 y.o. [id number] whose LMP was Patient's last menstrual period was 02/13/2021 (exact date)., presents today for urinary frequency, urgency, urine odor and dysuria since this AM. Having period so unsure about hematuria. Taking AZO. No LBP, pelvic pain, fevers. No vag sx. Hx of E. Coli 5/21 and proteus 10/19 but 1 resistant to bactrim and the other macrobid. Pt getting married in 4 days then leaving country on honeymoon. Concerned about not getting right tx for bacteria.   Past Medical History:  Diagnosis Date  . Abnormal Pap smear of vagina   . Allergy   . Anxiety   . Cervical high risk human papillomavirus (HPV) DNA test positive 03/2018  . Cholelithiases 03/2018   on renal u/s, no sx  . Kidney stone     Past Surgical History:  Procedure Laterality Date  . WISDOM TOOTH EXTRACTION      Family History  Problem Relation Age of Onset  . Diabetes Maternal Grandfather   . Lung cancer Paternal Grandfather   . Stroke Father   . Dementia Father   . Diabetes Father   . Bipolar disorder Father   . Anxiety disorder Mother   . Alzheimer's disease Paternal Grandmother   . Breast cancer Neg Hx   . Ovarian cancer Neg Hx     Social History   Socioeconomic History  . Marital status: Single    Spouse name: Not on file  . Number of children: Not on file  . Years of education: Not on file  . Highest education level: Not on file  Occupational History  . Not on file  Tobacco Use  . Smoking status: Never Smoker  . Smokeless tobacco: Never Used  Vaping Use  . Vaping Use: Never used  Substance and Sexual Activity  . Alcohol use: Yes    Comment: occasional  . Drug use: No  . Sexual activity: Yes    Birth control/protection: Pill  Other Topics Concern  . Not on file  Social History Narrative  . Not on  file   Social Determinants of Health   Financial Resource Strain: Not on file  Food Insecurity: Not on file  Transportation Needs: Not on file  Physical Activity: Not on file  Stress: Not on file  Social Connections: Not on file  Intimate Partner Violence: Not on file    Outpatient Medications Prior to Visit  Medication Sig Dispense Refill  . Cholecalciferol (VITAMIN D3) 50 MCG (2000 UT) TABS Take 1 tablet by mouth daily.    . Drospirenone (SLYND) 4 MG TABS Take 1 tablet by mouth daily. 28 tablet 0  . Multiple Vitamin (MULTIVITAMIN) tablet Take 1 tablet by mouth daily.    Marland Kitchen triamcinolone cream (KENALOG) 0.1 % Apply 1 application topically 2 (two) times daily.    . Melatonin 10 MG CAPS Take 1 capsule by mouth at bedtime.     No facility-administered medications prior to visit.      ROS:  Review of Systems  Constitutional: Negative for fever.  Gastrointestinal: Negative for blood in stool, constipation, diarrhea, nausea and vomiting.  Genitourinary: Positive for dysuria, frequency and urgency. Negative for dyspareunia, flank pain, hematuria, vaginal bleeding, vaginal discharge and vaginal pain.  Musculoskeletal: Negative for back pain.  Skin: Negative  for rash.    OBJECTIVE:   Vitals:  BP 90/70   Ht 5\' 5"  (1.651 m)   Wt 153 lb (69.4 kg)   LMP 02/13/2021 (Exact Date)   BMI 25.46 kg/m   Physical Exam Vitals reviewed.  Constitutional:      Appearance: She is well-developed. She is not ill-appearing or toxic-appearing.  Pulmonary:     Effort: Pulmonary effort is normal.  Abdominal:     Tenderness: There is no right CVA tenderness or left CVA tenderness.  Musculoskeletal:        General: Normal range of motion.     Cervical back: Normal range of motion.  Neurological:     General: No focal deficit present.     Mental Status: She is alert and oriented to person, place, and time.     Cranial Nerves: No cranial nerve deficit.  Psychiatric:        Behavior: Behavior  normal.        Thought Content: Thought content normal.        Judgment: Judgment normal.     Results: Results for orders placed or performed in visit on 02/14/21 (from the past 24 hour(s))  POCT Urinalysis Dipstick     Status: Abnormal   Collection Time: 02/14/21 11:17 AM  Result Value Ref Range   Color, UA orange    Clarity, UA     Glucose, UA     Bilirubin, UA     Ketones, UA     Spec Grav, UA 1.010 1.010 - 1.025   Blood, UA mod    pH, UA 7.5 5.0 - 8.0   Protein, UA     Urobilinogen, UA     Nitrite, UA     Leukocytes, UA Moderate (2+) (A) Negative   Appearance     Odor     PT TAKING AZO  Assessment/Plan: Acute cystitis with hematuria - Plan: ciprofloxacin (CIPRO) 500 MG tablet, fluconazole (DIFLUCAN) 150 MG tablet, POCT Urinalysis Dipstick, Urine Culture; pos sx and UA. Rx cipro (both previous bacteria on C&S susceptible to cipro). Check C&S. F/u prn.  Rx diflucan prn yeast vag sx after abx on honeymoon.  Meds ordered this encounter  Medications  . ciprofloxacin (CIPRO) 500 MG tablet    Sig: Take 1 tablet (500 mg total) by mouth 2 (two) times daily for 3 days.    Dispense:  6 tablet    Refill:  0    Order Specific Question:   Supervising Provider    Answer:   02/16/21 Nadara Mustard  . fluconazole (DIFLUCAN) 150 MG tablet    Sig: Take 1 tablet (150 mg total) by mouth once for 1 dose.    Dispense:  1 tablet    Refill:  0    Order Specific Question:   Supervising Provider    Answer:   [id number] Nadara Mustard      Return if symptoms worsen or fail to improve.  Toni Hoffmeister B. Jessie Cowher, PA-C 02/14/2021 11:18 AM

## 2021-02-16 LAB — URINE CULTURE

## 2021-04-27 ENCOUNTER — Other Ambulatory Visit: Payer: Self-pay

## 2021-04-27 ENCOUNTER — Ambulatory Visit: Payer: BLUE CROSS/BLUE SHIELD | Admitting: Nurse Practitioner

## 2021-04-27 ENCOUNTER — Encounter: Payer: Self-pay | Admitting: Nurse Practitioner

## 2021-04-27 VITALS — BP 91/60 | HR 67 | Temp 98.3°F | Ht 64.5 in | Wt 158.8 lb

## 2021-04-27 DIAGNOSIS — Z8659 Personal history of other mental and behavioral disorders: Secondary | ICD-10-CM | POA: Diagnosis not present

## 2021-04-27 DIAGNOSIS — Z7689 Persons encountering health services in other specified circumstances: Secondary | ICD-10-CM

## 2021-04-27 DIAGNOSIS — Z0000 Encounter for general adult medical examination without abnormal findings: Secondary | ICD-10-CM

## 2021-04-27 DIAGNOSIS — Z87442 Personal history of urinary calculi: Secondary | ICD-10-CM | POA: Insufficient documentation

## 2021-04-27 NOTE — Patient Instructions (Signed)

## 2021-04-27 NOTE — Assessment & Plan Note (Signed)
Monitor closely and treat when symptoms present -- has frequent UTI history as well.  Has been followed by GYN for this, but is aware she can come here as needed for symptoms.

## 2021-04-27 NOTE — Assessment & Plan Note (Signed)
Monitor mood and restart medication as needed, previously took Zoloft.

## 2021-04-27 NOTE — Progress Notes (Signed)
New Patient Office Visit  Subjective:  Patient ID: SAMIYA MERVIN, female    DOB: May 17, 1986  Age: 35 y.o. MRN: [id number]  CC:  Chief Complaint  Patient presents with   Establish Care    Patient states she was seen in the past from a different provider placed a result in patient MyChart and it states "low protein" and patient would like to discuss as she was going to with that provider at the time, but the provider left the practice.     HPI Lakima E Clint Guy presents for new patient visit to establish care.  Introduced to Publishing rights manager role and practice setting.  All questions answered.  Discussed provider/patient relationship and expectations. She was previously followed by NP.  Is followed is followed by GYN for paps and UTIs -- [facility].  She does eat and drink a lot of protein + works out a lot (4-5 days a week, for one hour or more).  Recent protein on labs 5.5.    Does have history of anxiety -- took Zoloft in high school, has not taken medication in several years.   History of kidney stones in 2019, last incident in 2020.  Never has had surgery, they passed on their own.     Past Medical History:  Diagnosis Date   Abnormal Pap smear of vagina    Allergy    Anxiety    Cervical high risk human papillomavirus (HPV) DNA test positive 03/2018   Cholelithiases 03/2018   on renal u/s, no sx   Kidney stone     Past Surgical History:  Procedure Laterality Date   WISDOM TOOTH EXTRACTION      Family History  Problem Relation Age of Onset   Hyperlipidemia Mother    Anxiety disorder Mother    Stroke Father 44   Dementia Father    Diabetes Father    Bipolar disorder Father    Anxiety disorder Brother    Diabetes Maternal Grandfather    Alzheimer's disease Paternal Grandmother    Lung cancer Paternal Grandfather    Ovarian cancer Neg Hx     Social History   Socioeconomic History   Marital status: Married    Spouse name: Not on file   Number of  children: Not on file   Years of education: Not on file   Highest education level: Not on file  Occupational History   Not on file  Tobacco Use   Smoking status: Never   Smokeless tobacco: Never  Vaping Use   Vaping Use: Never used  Substance and Sexual Activity   Alcohol use: Yes    Comment: occasional   Drug use: No   Sexual activity: Yes    Birth control/protection: Pill  Other Topics Concern   Not on file  Social History Narrative   Not on file   Social Determinants of Health   Financial Resource Strain: Low Risk    Difficulty of Paying Living Expenses: Not hard at all  Food Insecurity: No Food Insecurity   Worried About Programme researcher, broadcasting/film/video in the Last Year: Never true   Ran Out of Food in the Last Year: Never true  Transportation Needs: No Transportation Needs   Lack of Transportation (Medical): No   Lack of Transportation (Non-Medical): No  Physical Activity: Sufficiently Active   Days of Exercise per Week: 5 days   Minutes of Exercise per Session: 60 min  Stress: No Stress Concern Present   Feeling of Stress :  Not at all  Social Connections: Moderately Integrated   Frequency of Communication with Friends and Family: More than three times a week   Frequency of Social Gatherings with Friends and Family: More than three times a week   Attends Religious Services: More than 4 times per year   Active Member of [employer] or Organizations: No   Attends Engineer, structural: Never   Marital Status: Married  Catering manager Violence: Not At Risk   Fear of Current or Ex-Partner: No   Emotionally Abused: No   Physically Abused: No   Sexually Abused: No    ROS Review of Systems  Constitutional:  Negative for activity change, appetite change, diaphoresis, fatigue and fever.  Respiratory:  Negative for cough, chest tightness and shortness of breath.   Cardiovascular:  Negative for chest pain, palpitations and leg swelling.  Gastrointestinal: Negative.    Neurological: Negative.   Psychiatric/Behavioral: Negative.     Objective:   Today's Vitals: BP 91/60   Pulse 67   Temp 98.3 F (36.8 C) (Oral)   Ht 5' 4.5" (1.638 m)   Wt 158 lb 12.8 oz (72 kg)   SpO2 98%   BMI 26.84 kg/m   Physical Exam Vitals and nursing note reviewed.  Constitutional:      General: She is awake. She is not in acute distress.    Appearance: She is well-developed and well-groomed. She is not ill-appearing or toxic-appearing.  HENT:     Head: Normocephalic.     Right Ear: Hearing normal.     Left Ear: Hearing normal.  Eyes:     General: Lids are normal.        Right eye: No discharge.        Left eye: No discharge.     Conjunctiva/sclera: Conjunctivae normal.     Pupils: Pupils are equal, round, and reactive to light.  Neck:     Thyroid: No thyromegaly.     Vascular: No carotid bruit.  Cardiovascular:     Rate and Rhythm: Normal rate and regular rhythm.     Heart sounds: Normal heart sounds. No murmur heard.   No gallop.  Pulmonary:     Effort: Pulmonary effort is normal. No accessory muscle usage or respiratory distress.     Breath sounds: Normal breath sounds.  Abdominal:     General: Bowel sounds are normal.     Palpations: Abdomen is soft. There is no hepatomegaly or splenomegaly.  Musculoskeletal:     Cervical back: Normal range of motion and neck supple.     Right lower leg: No edema.     Left lower leg: No edema.  Lymphadenopathy:     Cervical: No cervical adenopathy.  Skin:    General: Skin is warm and dry.  Neurological:     Mental Status: She is alert and oriented to person, place, and time.  Psychiatric:        Attention and Perception: Attention normal.        Mood and Affect: Mood normal.        Speech: Speech normal.        Behavior: Behavior normal. Behavior is cooperative.        Thought Content: Thought content normal.    Assessment & Plan:   Problem List Items Addressed This Visit       Other   History of anxiety  disorder    Monitor mood and restart medication as needed, previously took Zoloft.  History of kidney stones    Monitor closely and treat when symptoms present -- has frequent UTI history as well.  Has been followed by GYN for this, but is aware she can come here as needed for symptoms.       Healthcare maintenance    Discussed recent labs with her -- protein only mildly low and works out often, increase protein in diet.  Due for: - HIV and Hep C screening -- obtain in 4-6 weeks at physical and discuss with patient - Covid booster -- recommend       Other Visit Diagnoses     Encounter to establish care    -  Primary       Outpatient Encounter Medications as of 04/27/2021  Medication Sig   Cholecalciferol (VITAMIN D3) 50 MCG (2000 UT) TABS Take 1 tablet by mouth daily.   Drospirenone (SLYND) 4 MG TABS Take 1 tablet by mouth daily.   Multiple Vitamin (MULTIVITAMIN) tablet Take 1 tablet by mouth daily.   triamcinolone cream (KENALOG) 0.1 % Apply 1 application topically 2 (two) times daily.   No facility-administered encounter medications on file as of 04/27/2021.    Follow-up: Return in about 4 weeks (around 05/25/2021) for Annual physical.   Marjie Skiff, NP

## 2021-04-27 NOTE — Assessment & Plan Note (Signed)
Discussed recent labs with her -- protein only mildly low and works out often, increase protein in diet.  Due for: - HIV and Hep C screening -- obtain in 4-6 weeks at physical and discuss with patient - Covid booster -- recommend

## 2021-04-30 ENCOUNTER — Other Ambulatory Visit: Payer: Self-pay | Admitting: Obstetrics and Gynecology

## 2021-04-30 DIAGNOSIS — Z3041 Encounter for surveillance of contraceptive pills: Secondary | ICD-10-CM

## 2021-05-01 ENCOUNTER — Other Ambulatory Visit: Payer: Self-pay | Admitting: Obstetrics and Gynecology

## 2021-05-01 ENCOUNTER — Encounter: Payer: Self-pay | Admitting: Obstetrics and Gynecology

## 2021-05-01 DIAGNOSIS — Z3041 Encounter for surveillance of contraceptive pills: Secondary | ICD-10-CM

## 2021-05-01 MED ORDER — SLYND 4 MG PO TABS: 1.0000 | ORAL_TABLET | Freq: Every day | ORAL | 0 refills | Status: DC

## 2021-05-02 ENCOUNTER — Other Ambulatory Visit: Payer: Self-pay | Admitting: Obstetrics and Gynecology

## 2021-05-02 DIAGNOSIS — Z3041 Encounter for surveillance of contraceptive pills: Secondary | ICD-10-CM

## 2021-05-02 NOTE — Telephone Encounter (Addendum)
Pt called saying RF not at pharmacy. Checked Rx and for some odd reason it was a print Rx not ERxd. Pt chose different pharmacy ([address] near [employer]) in which I called to give verbal but was told Rx won't be ready for pick up until tom afternoon. Pt aware, says she is completely out of BC. I offered to call [facility] besides [address] [address] and they have one pack for pick up for this afternoon. Spoke to [place] there and was advised to watch the way I talk to pt because pt had been talking bad about our office saying we were not doing our job. Mychart msg sent to pt that Rx available at [facility] besides [facility].

## 2021-05-03 ENCOUNTER — Encounter: Payer: Self-pay | Admitting: Obstetrics and Gynecology

## 2021-05-03 NOTE — Telephone Encounter (Signed)
Called [facility] to confirm Rx there. Spoke to [place] and was advised Rx is there for pick up. Pt had been calling [facility] near [employer]. Pt aware.

## 2021-06-01 ENCOUNTER — Other Ambulatory Visit: Payer: Self-pay

## 2021-06-01 ENCOUNTER — Ambulatory Visit (INDEPENDENT_AMBULATORY_CARE_PROVIDER_SITE_OTHER): Payer: BLUE CROSS/BLUE SHIELD | Admitting: Nurse Practitioner

## 2021-06-01 ENCOUNTER — Encounter: Payer: Self-pay | Admitting: Nurse Practitioner

## 2021-06-01 VITALS — BP 100/69 | HR 80 | Temp 98.8°F | Ht 65.5 in | Wt 162.6 lb

## 2021-06-01 DIAGNOSIS — Z1159 Encounter for screening for other viral diseases: Secondary | ICD-10-CM

## 2021-06-01 DIAGNOSIS — Z8249 Family history of ischemic heart disease and other diseases of the circulatory system: Secondary | ICD-10-CM

## 2021-06-01 DIAGNOSIS — Z0000 Encounter for general adult medical examination without abnormal findings: Secondary | ICD-10-CM

## 2021-06-01 DIAGNOSIS — Z114 Encounter for screening for human immunodeficiency virus [HIV]: Secondary | ICD-10-CM | POA: Diagnosis not present

## 2021-06-01 DIAGNOSIS — Z136 Encounter for screening for cardiovascular disorders: Secondary | ICD-10-CM | POA: Diagnosis not present

## 2021-06-01 DIAGNOSIS — M25551 Pain in right hip: Secondary | ICD-10-CM | POA: Insufficient documentation

## 2021-06-01 NOTE — Patient Instructions (Signed)

## 2021-06-01 NOTE — Assessment & Plan Note (Signed)
No pain, more noted occasional sounds with certain movements.  At this time recommend stretching before and after work-outs, which she is not currently doing, to help strengthen and warm-up prior to hard work-out and help avoid injury.

## 2021-06-01 NOTE — Assessment & Plan Note (Signed)
Due for: - HIV and Hep C screening -- obtain today - Covid booster -- recommend

## 2021-06-01 NOTE — Progress Notes (Signed)
BP 100/69   Pulse 80   Temp 98.8 F (37.1 C) (Oral)   Ht 5' 5.5" (1.664 m)   Wt 162 lb 9.6 oz (73.8 kg)   LMP 05/29/2021 (Approximate)   SpO2 98%   BMI 26.65 kg/m    Subjective:    Patient ID: Kayla Townsend, female    DOB: [birth year], 35 y.o.   MRN: [id number]  HPI: Kayla Townsend is a 35 y.o. female presenting on 06/01/2021 for comprehensive medical examination. Current medical complaints include: Hip and groin issue  She currently lives with: husband Menopausal Symptoms: no  HIP DISCOMFORT Notices when working out -- notices most with leg raise, when legs coming back down.  No pain with this, only a thud kind of feeling.  Notices more on right side.   Duration: chronic Involved hip: bilateral  Aggravating factors: as above   Alleviating factors: nothing  Status: stable Treatments attempted: none   Relief with NSAIDs?: no Weakness with weight bearing: no Weakness with walking: no Paresthesias / decreased sensation: no Swelling: no Redness:no Fevers: no   Depression Screen done today and results listed below:  [facility] 06/01/2021 05/19/2020  Decreased Interest 0 0  Down, Depressed, Hopeless 0 0  PHQ - 2 Score 0 0    The patient does not have a history of falls. I did not complete a risk assessment for falls. A plan of care for falls was not documented.   Past Medical History:  Past Medical History:  Diagnosis Date   Abnormal Pap smear of vagina    Allergy    Anxiety    Cervical high risk human papillomavirus (HPV) DNA test positive 03/2018   Cholelithiases 03/2018   on renal u/s, no sx   Kidney stone     Surgical History:  Past Surgical History:  Procedure Laterality Date   WISDOM TOOTH EXTRACTION      Medications:  Current Outpatient Medications on File Prior to Visit  Medication Sig   Cholecalciferol (VITAMIN D3) 50 MCG (2000 UT) TABS Take 1 tablet by mouth daily.   Drospirenone (SLYND) 4 MG TABS Take 1 tablet by  mouth daily.   Multiple Vitamin (MULTIVITAMIN) tablet Take 1 tablet by mouth daily.   triamcinolone cream (KENALOG) 0.1 % Apply 1 application topically 2 (two) times daily.   No current facility-administered medications on file prior to visit.    Allergies:  Allergies  Allergen Reactions   Ceclor [Cefaclor] Swelling    As a child    Social History:  Social History   Socioeconomic History   Marital status: Married    Spouse name: Not on file   Number of children: Not on file   Years of education: Not on file   Highest education level: Not on file  Occupational History   Not on file  Tobacco Use   Smoking status: Never   Smokeless tobacco: Never  Vaping Use   Vaping Use: Never used  Substance and Sexual Activity   Alcohol use: Yes    Comment: occasional   Drug use: No   Sexual activity: Yes    Birth control/protection: Pill  Other Topics Concern   Not on file  Social History Narrative   Not on file   Social Determinants of Health   Financial Resource Strain: Low Risk    Difficulty of Paying Living Expenses: Not hard at all  Food Insecurity: No Food Insecurity   Worried About Programme researcher, broadcasting/film/videounning Out of Food in the  Last Year: Never true   Ran Out of Food in the Last Year: Never true  Transportation Needs: No Transportation Needs   Lack of Transportation (Medical): No   Lack of Transportation (Non-Medical): No  Physical Activity: Sufficiently Active   Days of Exercise per Week: 5 days   Minutes of Exercise per Session: 60 min  Stress: No Stress Concern Present   Feeling of Stress : Not at all  Social Connections: Moderately Integrated   Frequency of Communication with Friends and Family: More than three times a week   Frequency of Social Gatherings with Friends and Family: More than three times a week   Attends Religious Services: More than 4 times per year   Active Member of [employer] or Organizations: No   Attends Engineer, structural: Never   Marital Status:  Married  Catering manager Violence: Not At Risk   Fear of Current or Ex-Partner: No   Emotionally Abused: No   Physically Abused: No   Sexually Abused: No   Social History   Tobacco Use  Smoking Status Never  Smokeless Tobacco Never   Social History   Substance and Sexual Activity  Alcohol Use Yes   Comment: occasional    Family History:  Family History  Problem Relation Age of Onset   Hyperlipidemia Mother    Anxiety disorder Mother    Stroke Father 31   Dementia Father    Diabetes Father    Bipolar disorder Father    Anxiety disorder Brother    Diabetes Maternal Grandfather    Alzheimer's disease Paternal Grandmother    Lung cancer Paternal Grandfather    Ovarian cancer Neg Hx     Past medical history, surgical history, medications, allergies, family history and social history reviewed with patient today and changes made to appropriate areas of the chart.   ROS All other ROS negative except what is listed above and in the HPI.      Objective:    BP 100/69   Pulse 80   Temp 98.8 F (37.1 C) (Oral)   Ht 5' 5.5" (1.664 m)   Wt 162 lb 9.6 oz (73.8 kg)   LMP 05/29/2021 (Approximate)   SpO2 98%   BMI 26.65 kg/m   Wt Readings from Last 3 Encounters:  06/01/21 162 lb 9.6 oz (73.8 kg)  04/27/21 158 lb 12.8 oz (72 kg)  02/14/21 153 lb (69.4 kg)    Physical Exam Vitals and nursing note reviewed. Exam conducted with a chaperone present.  Constitutional:      General: She is awake. She is not in acute distress.    Appearance: She is well-developed. She is not ill-appearing.  HENT:     Head: Normocephalic and atraumatic.     Right Ear: Hearing, tympanic membrane, ear canal and external ear normal. No drainage.     Left Ear: Hearing, tympanic membrane, ear canal and external ear normal. No drainage.     Nose: Nose normal.     Right Sinus: No maxillary sinus tenderness or frontal sinus tenderness.     Left Sinus: No maxillary sinus tenderness or frontal sinus  tenderness.     Mouth/Throat:     Mouth: Mucous membranes are moist.     Pharynx: Oropharynx is clear. Uvula midline. No pharyngeal swelling, oropharyngeal exudate or posterior oropharyngeal erythema.  Eyes:     General: Lids are normal.        Right eye: No discharge.        Left  eye: No discharge.     Extraocular Movements: Extraocular movements intact.     Conjunctiva/sclera: Conjunctivae normal.     Pupils: Pupils are equal, round, and reactive to light.     Visual Fields: Right eye visual fields normal and left eye visual fields normal.  Neck:     Thyroid: No thyromegaly.     Vascular: No carotid bruit.     Trachea: Trachea normal.  Cardiovascular:     Rate and Rhythm: Normal rate and regular rhythm.     Heart sounds: Normal heart sounds. No murmur heard.   No gallop.  Pulmonary:     Effort: Pulmonary effort is normal. No accessory muscle usage or respiratory distress.     Breath sounds: Normal breath sounds.  Abdominal:     General: Bowel sounds are normal.     Palpations: Abdomen is soft. There is no hepatomegaly or splenomegaly.     Tenderness: There is no abdominal tenderness.  Musculoskeletal:        General: Normal range of motion.     Cervical back: Normal range of motion and neck supple.     Right lower leg: No edema.     Left lower leg: No edema.  Lymphadenopathy:     Head:     Right side of head: No submental, submandibular, tonsillar, preauricular or posterior auricular adenopathy.     Left side of head: No submental, submandibular, tonsillar, preauricular or posterior auricular adenopathy.     Cervical: No cervical adenopathy.  Skin:    General: Skin is warm and dry.     Capillary Refill: Capillary refill takes less than 2 seconds.     Findings: No rash.  Neurological:     Mental Status: She is alert and oriented to person, place, and time.     Cranial Nerves: Cranial nerves are intact.     Gait: Gait is intact.     Deep Tendon Reflexes: Reflexes are  normal and symmetric.     Reflex Scores:      Brachioradialis reflexes are 2+ on the right side and 2+ on the left side.      Patellar reflexes are 2+ on the right side and 2+ on the left side. Psychiatric:        Attention and Perception: Attention normal.        Mood and Affect: Mood normal.        Speech: Speech normal.        Behavior: Behavior normal. Behavior is cooperative.        Thought Content: Thought content normal.        Judgment: Judgment normal.    Results for orders placed or performed in visit on 02/14/21  Urine Culture   Specimen: Urine, Clean Catch   UR  Result Value Ref Range   Urine Culture, Routine Final report    Organism ID, Bacteria Comment   POCT Urinalysis Dipstick  Result Value Ref Range   Color, UA orange    Clarity, UA     Glucose, UA     Bilirubin, UA     Ketones, UA     Spec Grav, UA 1.010 1.010 - 1.025   Blood, UA mod    pH, UA 7.5 5.0 - 8.0   Protein, UA     Urobilinogen, UA     Nitrite, UA     Leukocytes, UA Moderate (2+) (A) Negative   Appearance     Odor  Assessment & Plan:   Problem List Items Addressed This Visit       Other   Healthcare maintenance    Due for: - HIV and Hep C screening -- obtain today - Covid booster -- recommend      Discomfort of right hip - Primary    No pain, more noted occasional sounds with certain movements.  At this time recommend stretching before and after work-outs, which she is not currently doing, to help strengthen and warm-up prior to hard work-out and help avoid injury.      Other Visit Diagnoses     Family history of cardiac disorder       Lipid panel today on labs   Relevant Orders   Comprehensive metabolic panel   Lipid Panel w/o Chol/HDL Ratio   Need for hepatitis C screening test       Hep C screening x 1 per recommendations today, discussed with patient.   Relevant Orders   Hepatitis C antibody   Encounter for screening for HIV       HIV screening x 1 per  recommendations today, discussed with patient.   Relevant Orders   HIV Antibody (routine testing w rflx)   Encounter for annual physical exam       Annual labs today: CBC, CMP, TSH, lipid.  Health maintenance reviewed.   Relevant Orders   CBC with Differential/Platelet   TSH        Follow up plan: Return in about 1 year (around 06/01/2022) for Annual physical.   LABORATORY TESTING:  - Pap smear: up to date  IMMUNIZATIONS:   - Tdap: Tetanus vaccination status reviewed: last tetanus booster within 10 years. - Influenza: Up to date - Pneumovax: Not applicable - Prevnar: Not applicable - HPV: Not applicable - Zostavax vaccine: Not applicable  SCREENING: -Mammogram: Not applicable  - Colonoscopy: Not applicable  - Bone Density: Not applicable  -Hearing Test: Not applicable  -Spirometry: Not applicable   PATIENT COUNSELING:   Advised to take 1 mg of folate supplement per day if capable of pregnancy.   Sexuality: Discussed sexually transmitted diseases, partner selection, use of condoms, avoidance of unintended pregnancy  and contraceptive alternatives.   Advised to avoid cigarette smoking.  I discussed with the patient that most people either abstain from alcohol or drink within safe limits (<=14/week and <=4 drinks/occasion for males, <=7/weeks and <= 3 drinks/occasion for females) and that the risk for alcohol disorders and other health effects rises proportionally with the number of drinks per week and how often a drinker exceeds daily limits.  Discussed cessation/primary prevention of drug use and availability of treatment for abuse.   Diet: Encouraged to adjust caloric intake to maintain  or achieve ideal body weight, to reduce intake of dietary saturated fat and total fat, to limit sodium intake by avoiding high sodium foods and not adding table salt, and to maintain adequate dietary potassium and calcium preferably from fresh fruits, vegetables, and low-fat dairy products.     Stressed the importance of regular exercise  Injury prevention: Discussed safety belts, safety helmets, smoke detector, smoking near bedding or upholstery.   Dental health: Discussed importance of regular tooth brushing, flossing, and dental visits.    NEXT PREVENTATIVE PHYSICAL DUE IN 1 YEAR. Return in about 1 year (around 06/01/2022) for Annual physical.

## 2021-06-02 LAB — COMPREHENSIVE METABOLIC PANEL
ALT: 23 IU/L (ref 0–32)
AST: 29 IU/L (ref 0–40)
Albumin/Globulin Ratio: 2.1 (ref 1.2–2.2)
Albumin: 3.7 g/dL — ABNORMAL LOW (ref 3.8–4.8)
Alkaline Phosphatase: 52 IU/L (ref 44–121)
BUN/Creatinine Ratio: 17 (ref 9–23)
BUN: 13 mg/dL (ref 6–20)
Bilirubin Total: <0.2 mg/dL (ref 0.0–1.2)
CO2: 24 mmol/L (ref 20–29)
Calcium: 8.9 mg/dL (ref 8.7–10.2)
Chloride: 104 mmol/L (ref 96–106)
Creatinine, Ser: 0.75 mg/dL (ref 0.57–1.00)
Globulin, Total: 1.8 g/dL (ref 1.5–4.5)
Glucose: 75 mg/dL (ref 65–99)
Potassium: 4.2 mmol/L (ref 3.5–5.2)
Sodium: 138 mmol/L (ref 134–144)
Total Protein: 5.5 g/dL — ABNORMAL LOW (ref 6.0–8.5)
eGFR: 108 mL/min/{1.73_m2} (ref >59)

## 2021-06-02 LAB — CBC WITH DIFFERENTIAL/PLATELET
Basophils Absolute: 0 10*3/uL (ref 0.0–0.2)
Basos: 0 %
EOS (ABSOLUTE): 0.2 10*3/uL (ref 0.0–0.4)
Eos: 2 %
Hematocrit: 48.2 % — ABNORMAL HIGH (ref 34.0–46.6)
Hemoglobin: 16.8 g/dL — ABNORMAL HIGH (ref 11.1–15.9)
Immature Grans (Abs): 0 10*3/uL (ref 0.0–0.1)
Immature Granulocytes: 0 %
Lymphocytes Absolute: 2.2 10*3/uL (ref 0.7–3.1)
Lymphs: 24 %
MCH: 32 pg (ref 26.6–33.0)
MCHC: 34.9 g/dL (ref 31.5–35.7)
MCV: 92 fL (ref 79–97)
Monocytes Absolute: 0.7 10*3/uL (ref 0.1–0.9)
Monocytes: 7 %
Neutrophils Absolute: 6.3 10*3/uL (ref 1.4–7.0)
Neutrophils: 67 %
Platelets: 236 10*3/uL (ref 150–450)
RBC: 5.25 x10E6/uL (ref 3.77–5.28)
RDW: 12.5 % (ref 11.7–15.4)
WBC: 9.4 10*3/uL (ref 3.4–10.8)

## 2021-06-02 LAB — LIPID PANEL W/O CHOL/HDL RATIO
Cholesterol, Total: 138 mg/dL (ref 100–199)
HDL: 55 mg/dL (ref >39)
LDL Chol Calc (NIH): 70 mg/dL (ref 0–99)
Triglycerides: 61 mg/dL (ref 0–149)
VLDL Cholesterol Cal: 13 mg/dL (ref 5–40)

## 2021-06-02 LAB — HEPATITIS C ANTIBODY: Hep C Virus Ab: <0.1 s/co ratio (ref 0.0–0.9)

## 2021-06-02 LAB — HIV ANTIBODY (ROUTINE TESTING W REFLEX): HIV Screen 4th Generation wRfx: NONREACTIVE

## 2021-06-02 LAB — TSH: TSH: 2.25 u[IU]/mL (ref 0.450–4.500)

## 2021-06-02 NOTE — Progress Notes (Signed)
Contacted via MyChart   Good afternoon Sheliah, your labs have returned: - CBC shows some mild elevation in hemoglobin and hematocrit -- this could have many factors, including exercise which enhances body's ability to carry oxygen -- which these cells carry.  We will continue to monitor. - Protein level mildly low, I recommend continuing to get in protein supplement to help with your lifting and working out. - Remainder of labs are wonderful, no concerns on cholesterol, thyroid, or kidney and liver function.  Great labs.  Any questions? Keep being amazing!!  Thank you for allowing me to participate in your care.  I appreciate you. Kindest regards, Ivan Lacher

## 2021-06-07 NOTE — Progress Notes (Signed)
Chief Complaint  Patient presents with   Gynecologic Exam    Cycles have been all over the place sin Jan 2022, recently they are about every other week. Plans to conceive soon and has qs about BC     HPI:      Kayla Townsend is a 35 y.o. G0P0000 who LMP was Patient's last menstrual period was 05/29/2021 (approximate)., presents today for her annual examination. Her menses were absent with POPs last year, then were monthly, lasting a few days last fall. Now having bleeding every 10 days, lasting 12-14 days, light spotting, not correlating to placebo pills. Changed from depo 3/21. No dysmen. Wants to conceive 1/23.  Menses were monthly prior to [facility] in past. Hx of headaches with estrogen containing OCPs, including sprintec and aviane.  Didn't like IUD in the past.   Sex activity: sexually active--contraception POPs, no pain/bleeding. Last Pap: 04/21/20 Results were no abnormalities/neg HPV DNA. 04/17/19 Results were: ASCUS/ POS HPV DNA, 2019 with neg pap/POS HPV DNA. Had colpo 05/12/19 with Dr. Jerene Pitch with neg path.  Repeat due this yr Hx of STDs: HPV    There is no FH of breast cancer. There is no FH of ovarian cancer. The patient does do self-breast exams.   Tobacco use: The patient denies current or previous tobacco use. Alcohol use: none Drug use: none Exercise: very active   She does get adequate calcium and Vitamin D in her diet.    Past Medical History:  Diagnosis Date   Abnormal Pap smear of vagina    Allergy    Anxiety    Cervical high risk human papillomavirus (HPV) DNA test positive 03/2018   Cholelithiases 03/2018   on renal u/s, no sx   Kidney stone     Past Surgical History:  Procedure Laterality Date   WISDOM TOOTH EXTRACTION      Family History  Problem Relation Age of Onset   Hyperlipidemia Mother    Anxiety disorder Mother    Stroke Father 35   Dementia Father    Diabetes Father    Bipolar disorder Father    Anxiety disorder Brother     Diabetes Maternal Grandfather    Alzheimer's disease Paternal Grandmother    Lung cancer Paternal Grandfather    Ovarian cancer Neg Hx     Social History   Socioeconomic History   Marital status: Married    Spouse name: Not on file   Number of children: Not on file   Years of education: Not on file   Highest education level: Not on file  Occupational History   Not on file  Tobacco Use   Smoking status: Never   Smokeless tobacco: Never  Vaping Use   Vaping Use: Never used  Substance and Sexual Activity   Alcohol use: Yes    Comment: occasional   Drug use: No   Sexual activity: Yes    Birth control/protection: Pill  Other Topics Concern   Not on file  Social History Narrative   Not on file   Social Determinants of Health   Financial Resource Strain: Low Risk    Difficulty of Paying Living Expenses: Not hard at all  Food Insecurity: No Food Insecurity   Worried About Programme researcher, broadcasting/film/video in the Last Year: Never true   Ran Out of Food in the Last Year: Never true  Transportation Needs: No Transportation Needs   Lack of Transportation (Medical): No   Lack of Transportation (Non-Medical):  No  Physical Activity: Sufficiently Active   Days of Exercise per Week: 5 days   Minutes of Exercise per Session: 60 min  Stress: No Stress Concern Present   Feeling of Stress : Not at all  Social Connections: Moderately Integrated   Frequency of Communication with Friends and Family: More than three times a week   Frequency of Social Gatherings with Friends and Family: More than three times a week   Attends Religious Services: More than 4 times per year   Active Member of [employer] or Organizations: No   Attends Banker Meetings: Never   Marital Status: Married  Catering manager Violence: Not At Risk   Fear of Current or Ex-Partner: No   Emotionally Abused: No   Physically Abused: No   Sexually Abused: No    Current Outpatient Medications on File Prior to Visit   Medication Sig Dispense Refill   Cholecalciferol (VITAMIN D3) 50 MCG (2000 UT) TABS Take 1 tablet by mouth daily.     Drospirenone (SLYND) 4 MG TABS Take 1 tablet by mouth daily. 28 tablet 0   Multiple Vitamin (MULTIVITAMIN) tablet Take 1 tablet by mouth daily.     triamcinolone cream (KENALOG) 0.1 % Apply 1 application topically 2 (two) times daily.     No current facility-administered medications on file prior to visit.      ROS:  Review of Systems  Constitutional:  Negative for fatigue, fever and unexpected weight change.  Respiratory:  Negative for cough, shortness of breath and wheezing.   Cardiovascular:  Negative for chest pain, palpitations and leg swelling.  Gastrointestinal:  Negative for blood in stool, constipation, diarrhea, nausea and vomiting.  Endocrine: Negative for cold intolerance, heat intolerance and polyuria.  Genitourinary:  Positive for menstrual problem. Negative for dyspareunia, dysuria, flank pain, frequency, genital sores, hematuria, pelvic pain, urgency, vaginal bleeding, vaginal discharge and vaginal pain.  Musculoskeletal:  Negative for back pain, joint swelling and myalgias.  Skin:  Negative for rash.  Neurological:  Negative for dizziness, syncope, light-headedness, numbness and headaches.  Hematological:  Negative for adenopathy.  Psychiatric/Behavioral:  Negative for agitation, confusion, sleep disturbance and suicidal ideas. The patient is not nervous/anxious.     Objective: BP 110/64   Ht 5\' 5"  (1.651 m)   Wt 160 lb (72.6 kg)   LMP 05/29/2021 (Approximate)   BMI 26.63 kg/m    Physical Exam Constitutional:      Appearance: She is well-developed.  Genitourinary:     Vulva normal.     Right Labia: No rash, tenderness or lesions.    Left Labia: No tenderness, lesions or rash.    No vaginal discharge, erythema or tenderness.      Right Adnexa: not tender and no mass present.    Left Adnexa: not tender and no mass present.    No cervical  motion tenderness, friability or polyp.     Uterus is not enlarged or tender.  Breasts:    Right: No mass, nipple discharge, skin change or tenderness.     Left: No mass, nipple discharge, skin change or tenderness.  Neck:     Thyroid: No thyromegaly.  Cardiovascular:     Rate and Rhythm: Normal rate and regular rhythm.     Heart sounds: Normal heart sounds. No murmur heard. Pulmonary:     Effort: Pulmonary effort is normal.     Breath sounds: Normal breath sounds.  Abdominal:     Palpations: Abdomen is soft.  Tenderness: There is no abdominal tenderness. There is no guarding or rebound.  Musculoskeletal:        General: Normal range of motion.     Cervical back: Normal range of motion.  Lymphadenopathy:     Cervical: No cervical adenopathy.  Neurological:     General: No focal deficit present.     Mental Status: She is alert and oriented to person, place, and time.     Cranial Nerves: No cranial nerve deficit.  Skin:    General: Skin is warm and dry.  Psychiatric:        Mood and Affect: Mood normal.        Behavior: Behavior normal.        Thought Content: Thought content normal.        Judgment: Judgment normal.  Vitals reviewed.    Assessment/Plan: Encounter for annual routine gynecological examination  Cervical cancer screening - Plan: Cytology - PAP  Screening for HPV (human papillomavirus) - Plan: Cytology - PAP  ASCUS with positive high risk HPV cervical - Plan: Cytology - PAP; repeat pap today.  Breakthrough bleeding on POPs--since pt wants to conceive 1/23, will have her stop POPs now to see if menses return to normal. If not, will check labs/GYN u/s prior to wanting to conceive. Condoms in meantime. If menses normal, pt can try to conceive when ready.   Pre-conception counseling--start PNVs.    GYN counsel adequate intake of calcium and vitamin D, diet and exercise     F/U  Return in about 1 year (around 06/08/2022).  Trenika Hudson B. Kirstan Fentress,  PA-C 06/08/2021 10:36 AM

## 2021-06-08 ENCOUNTER — Other Ambulatory Visit (HOSPITAL_COMMUNITY)
Admission: RE | Admit: 2021-06-08 | Discharge: 2021-06-08 | Disposition: A | Discharge status: Home / Self Care | Payer: BLUE CROSS/BLUE SHIELD | Source: Ambulatory Visit | Attending: Obstetrics and Gynecology | Admitting: Obstetrics and Gynecology

## 2021-06-08 ENCOUNTER — Ambulatory Visit (INDEPENDENT_AMBULATORY_CARE_PROVIDER_SITE_OTHER): Payer: BLUE CROSS/BLUE SHIELD | Admitting: Obstetrics and Gynecology

## 2021-06-08 ENCOUNTER — Encounter: Payer: Self-pay | Admitting: Obstetrics and Gynecology

## 2021-06-08 ENCOUNTER — Other Ambulatory Visit: Payer: Self-pay

## 2021-06-08 VITALS — BP 110/64 | Ht 65.0 in | Wt 160.0 lb

## 2021-06-08 DIAGNOSIS — R87610 Atypical squamous cells of undetermined significance on cytologic smear of cervix (ASC-US): Secondary | ICD-10-CM | POA: Insufficient documentation

## 2021-06-08 DIAGNOSIS — Z1151 Encounter for screening for human papillomavirus (HPV): Secondary | ICD-10-CM | POA: Diagnosis not present

## 2021-06-08 DIAGNOSIS — Z3169 Encounter for other general counseling and advice on procreation: Secondary | ICD-10-CM

## 2021-06-08 DIAGNOSIS — Z3041 Encounter for surveillance of contraceptive pills: Secondary | ICD-10-CM

## 2021-06-08 DIAGNOSIS — Z124 Encounter for screening for malignant neoplasm of cervix: Secondary | ICD-10-CM | POA: Diagnosis not present

## 2021-06-08 DIAGNOSIS — R87810 Cervical high risk human papillomavirus (HPV) DNA test positive: Secondary | ICD-10-CM | POA: Insufficient documentation

## 2021-06-08 DIAGNOSIS — Z01419 Encounter for gynecological examination (general) (routine) without abnormal findings: Secondary | ICD-10-CM

## 2021-06-08 DIAGNOSIS — N921 Excessive and frequent menstruation with irregular cycle: Secondary | ICD-10-CM

## 2021-06-08 NOTE — Patient Instructions (Signed)
I value your feedback and you entrusting us with your care. If you get a [facility] patient survey, I would appreciate you taking the time to let us know about your experience today. Thank you! ? ? ?

## 2021-06-13 LAB — CYTOLOGY - PAP
Adequacy: ABSENT
Comment: NEGATIVE
Diagnosis: NEGATIVE
High risk HPV: NEGATIVE

## 2021-07-05 ENCOUNTER — Telehealth: Payer: Self-pay

## 2021-07-05 NOTE — Telephone Encounter (Signed)
Copied from CRM [id number]. Topic: General - Other >> Jul 05, 2021  9:34 AM Traci Sermon wrote: Reason for CRM: Pt called in wanting to get her immunization records, please advise.

## 2021-07-05 NOTE — Telephone Encounter (Signed)
Patient immunization record printed. Patient will pick up at front desk.

## 2021-09-25 ENCOUNTER — Encounter: Payer: Self-pay | Admitting: Nurse Practitioner

## 2021-10-09 DIAGNOSIS — L2084 Intrinsic (allergic) eczema: Secondary | ICD-10-CM | POA: Diagnosis not present

## 2022-02-09 ENCOUNTER — Encounter: Payer: Self-pay | Admitting: Obstetrics and Gynecology

## 2022-02-09 DIAGNOSIS — N926 Irregular menstruation, unspecified: Secondary | ICD-10-CM

## 2022-02-13 ENCOUNTER — Other Ambulatory Visit: Payer: BC Managed Care – PPO

## 2022-02-13 DIAGNOSIS — N926 Irregular menstruation, unspecified: Secondary | ICD-10-CM

## 2022-02-14 LAB — FSH/LH
FSH: 7.2 m[IU]/mL
LH: 4.9 m[IU]/mL

## 2022-02-14 LAB — T4, FREE: Free T4: 1.2 ng/dL (ref 0.82–1.77)

## 2022-02-14 LAB — TESTOSTERONE: Testosterone: 5 ng/dL — ABNORMAL LOW (ref 8–60)

## 2022-02-14 LAB — PROLACTIN: Prolactin: 9.7 ng/mL (ref 4.8–23.3)

## 2022-02-14 LAB — TSH: TSH: 1.14 u[IU]/mL (ref 0.450–4.500)

## 2022-02-19 ENCOUNTER — Telehealth: Payer: Self-pay

## 2022-02-19 NOTE — Telephone Encounter (Signed)
Kayla Townsend called triage line asking about her message from [facility] about her lab results and what to do.

## 2022-02-19 NOTE — Telephone Encounter (Signed)
I called Kayla Townsend and told her I don't physically see a mychart message from [facility] [place], but I can see where Kayla Townsend made a note from the lab results. Advised Kayla Townsend to look under her results tab on her mychart and if she still couldn't see them to call me back and I would copy and paste and send the message to her.

## 2022-02-23 ENCOUNTER — Encounter: Payer: Self-pay | Admitting: Nurse Practitioner

## 2022-02-23 ENCOUNTER — Telehealth (INDEPENDENT_AMBULATORY_CARE_PROVIDER_SITE_OTHER): Payer: BC Managed Care – PPO | Admitting: Nurse Practitioner

## 2022-02-23 DIAGNOSIS — Z298 Encounter for other specified prophylactic measures: Secondary | ICD-10-CM | POA: Diagnosis not present

## 2022-02-23 DIAGNOSIS — R110 Nausea: Secondary | ICD-10-CM | POA: Diagnosis not present

## 2022-02-23 DIAGNOSIS — Z2989 Encounter for other specified prophylactic measures: Secondary | ICD-10-CM | POA: Insufficient documentation

## 2022-02-23 MED ORDER — MECLIZINE HCL 12.5 MG PO TABS: ORAL_TABLET | ORAL | 0 refills | Status: DC

## 2022-02-23 MED ORDER — ATOVAQUONE-PROGUANIL HCL 250-100 MG PO TABS: ORAL_TABLET | ORAL | 0 refills | Status: DC

## 2022-02-23 NOTE — Patient Instructions (Signed)
Malaria  Malaria is a disease that is caused by a type of germ that can live inside a person's body (parasite). The malaria parasite can get into a person's blood when he or she is bitten by a certain type of mosquito (Anopheles mosquito). These mosquitoes are most common in tropical areas of the world. Usually, they are not found in the [place]. If an infected mosquito bites you, the parasites can travel through your blood to your liver. The parasites mature in your liver, then they are released into your blood. They can then invade red blood cells. The parasites multiply inside red blood cells and cause the cells to break open (rupture). This infects more red blood cells. Losing red blood cells may cause you to have a low red blood cell count (anemia). What are the causes? This condition is caused by a Plasmodium parasite. Five different types of the parasite can cause disease in humans. Most cases of malaria come from a mosquito bite, but the disease can also be passed: Through a blood transfusion. Through an organ transplant. By sharing used needles or syringes. From an infected pregnant woman to her unborn baby before or during delivery. What increases the risk? This condition is more likely to develop in: People who live or travel in an area of the world where malaria is common. People who receive donated blood (transfusion) or an organ transplant that is contaminated with infected blood cells. People who share needles with a person who is infected with malaria. Children. Pregnant women. People who have never been exposed to malaria parasites before. These people have not built up protection (immunity) against the parasites. What are the signs or symptoms? Symptoms can vary depending on which parasite caused the infection. Symptoms usually come and go or occur in cycles. The first symptoms of this condition usually start 10 days to 4 weeks after the mosquito bite. Symptoms for all  types of malaria usually happen in this order: Chills, along with headache, muscle aches, fatigue, nausea, and vomiting. Fever, along with hot and dry skin. Headache. Low blood pressure (hypotension). Drenching sweats, along with weakness and exhaustion. Other symptoms include: Diarrhea or bloody stools (feces). Blood in the urine. Low blood sugar (hypoglycemia). Yellowing of the skin and the whites of the eyes (jaundice). Enlarged spleen or liver. Kidney function problems. Severe symptoms include: Trouble breathing. Seizures. Confusion or loss of consciousness (coma). Problems with blood clotting. How is this diagnosed? This condition may be diagnosed based on: Your symptoms and medical history. Your health care provider may suspect malaria if you have been living or traveling in an area where the disease is common. A physical exam. Blood tests to confirm the diagnosis. These may include: Examining a blood sample under a microscope. This is the most common way to diagnose malaria. Each type of parasite looks different under the microscope. Identifying which parasite is causing your infection will help your health care provider decide which medicines will work best for treatment. Complete blood count (CBC) to check for anemia. How is this treated? This condition may be treated with many different medicines or combinations of medicines, often requiring treatment in the hospital. The best treatment for you will depend on: Which type of parasite is causing your infection. Whether various medicines are effective against it. How you got the infection. How severe your infection is. Your age and your general health. If you are pregnant. Follow these instructions at home:  Take over-the-counter and prescription medicines only as  told by your health care provider. Rest at home until your symptoms improve. Drink enough fluid to keep your urine pale yellow. Keep all follow-up visits as  told by your health care provider. This is important. How is this prevented? If you will be traveling to an area where malaria is common: Visit the website of the Centers for Disease Control and Prevention (CDC) to check your risk: KeyPreview.se Visit your health care provider at least 2 weeks before you leave. You may be given a medicine to help prevent malaria. You can also prevent malaria by: Using insect repellent that has diethyltoluamide (DEET) in it. Staying indoors after daytime starts to turn to night or dusk. Wearing protective clothing that covers your arms and legs. Hanging a mosquito net over your bed. Contact a health care provider if: You have any malaria symptoms. You develop jaundice. Get help right away if: You have a seizure. You have bleeding. You have trouble breathing. Summary Malaria is a disease that is caused by a type of germ that can live inside a person's body (parasite). The malaria parasite can get into a person's blood when he or she is bitten by a certain type of mosquito. Malaria can be prevented by taking measures to prevent mosquito bites. Treatment for malaria depends on many factors, including how severe the infection is and which type of parasite is causing it. Follow instructions from your health care provider about medicines, rest, getting enough fluids, and keeping all follow-up visits. This information is not intended to replace advice given to you by your health care provider. Make sure you discuss any questions you have with your health care provider. Document Revised: 02/08/2021 Document Reviewed: 02/08/2021 Elsevier Patient Education  2023 [employer].

## 2022-02-23 NOTE — Progress Notes (Signed)
There were no vitals taken for this visit.   Subjective:    Patient ID: Kayla Townsend, female    DOB: 08-Dec-1985, 36 y.o.   MRN: [id number]  HPI: Kayla Townsend is a 36 y.o. female  Chief Complaint  Patient presents with   Medication Consultation    Patient is here today to discuss prescription for upcoming travels. Patient denies having concerns at today's visit.    This visit was completed via video visit through MyChart due to the restrictions of the COVID-19 pandemic. All issues as above were discussed and addressed. Physical exam was done as above through visual confirmation on video through MyChart. If it was felt that the patient should be evaluated in the office, they were directed there. The patient verbally consented to this visit. Location of the patient: home Location of the provider: work Those involved with this call:  Provider: Aura Dials, DNP CMA: Malen Gauze, CMA Front Desk/Registration: [employer]  Time spent on call:  21 minutes with patient face to face via video conference. More than 50% of this time was spent in counseling and coordination of care. 15 minutes total spent in review of patient's record and preparation of their chart.  I verified patient identity using two factors (patient name and date of birth). Patient consents verbally to being seen via telemedicine visit today.    TRAVEL NEEDS: She works for [employer] and is traveling to [place] - leaving June 30th and returns July 10th.  She is in need of malaria prophylaxis medication for travel + needs medication for nausea (taken Meclizine before) -- tries Dramamine first.  Meclizine has worked well for her in past, helps with traveling from point to point.  She traveled to [place] in January, had all vaccinations updated then.  Relevant past medical, surgical, family and social history reviewed and updated as indicated. Interim medical history since our last visit  reviewed. Allergies and medications reviewed and updated.  Review of Systems  Constitutional:  Negative for activity change, appetite change, diaphoresis, fatigue and fever.  Respiratory:  Negative for cough, chest tightness and shortness of breath.   Cardiovascular:  Negative for chest pain, palpitations and leg swelling.  Gastrointestinal: Negative.   Neurological: Negative.   Psychiatric/Behavioral: Negative.     Per HPI unless specifically indicated above     Objective:    There were no vitals taken for this visit.  Wt Readings from Last 3 Encounters:  06/08/21 160 lb (72.6 kg)  06/01/21 162 lb 9.6 oz (73.8 kg)  04/27/21 158 lb 12.8 oz (72 kg)    Physical Exam Vitals and nursing note reviewed.  Constitutional:      General: She is awake. She is not in acute distress.    Appearance: She is well-developed. She is not ill-appearing.  HENT:     Head: Normocephalic.     Right Ear: Hearing normal.     Left Ear: Hearing normal.  Eyes:     General: Lids are normal.        Right eye: No discharge.        Left eye: No discharge.     Conjunctiva/sclera: Conjunctivae normal.  Pulmonary:     Effort: Pulmonary effort is normal. No accessory muscle usage or respiratory distress.  Musculoskeletal:     Cervical back: Normal range of motion.  Neurological:     Mental Status: She is alert and oriented to person, place, and time.  Psychiatric:  Attention and Perception: Attention normal.        Mood and Affect: Mood normal.        Behavior: Behavior normal. Behavior is cooperative.        Thought Content: Thought content normal.        Judgment: Judgment normal.    Results for orders placed or performed in visit on 02/13/22  TSH  Result Value Ref Range   TSH 1.140 0.450 - 4.500 uIU/mL  T4, free  Result Value Ref Range   Free T4 1.20 0.82 - 1.77 ng/dL  FSH/LH  Result Value Ref Range   LH 4.9 mIU/mL   FSH 7.2 mIU/mL  Prolactin  Result Value Ref Range   Prolactin 9.7  4.8 - 23.3 ng/mL  Testosterone  Result Value Ref Range   Testosterone 5 (L) 8 - 60 ng/dL      Assessment & Plan:   Problem List Items Addressed This Visit       Other   Nausea - Primary    Has nausea with travel for which Meclizine works well, will send script for this for her upcoming travel to [place].       Need for malaria prophylaxis    Is traveling to [place] with [employer] from June 30th to July 10th, in need of malaria prophylaxis -- CDC web site reviewed.  Chloroquine resistant area.  Will send in Malarone and instructions for use written on prescription.  Recommend she not attempt pregnancy while taking this medication.          I discussed the assessment and treatment plan with the patient. The patient was provided an opportunity to ask questions and all were answered. The patient agreed with the plan and demonstrated an understanding of the instructions.   The patient was advised to call back or seek an in-person evaluation if the symptoms worsen or if the condition fails to improve as anticipated.   I provided 21+ minutes of time during this encounter.   Follow up plan: Return if symptoms worsen or fail to improve.

## 2022-02-23 NOTE — Assessment & Plan Note (Signed)
Is traveling to [place] with [employer] from June 30th to July 10th, in need of malaria prophylaxis -- CDC web site reviewed.  Chloroquine resistant area.  Will send in Malarone and instructions for use written on prescription.  Recommend she not attempt pregnancy while taking this medication.

## 2022-02-23 NOTE — Assessment & Plan Note (Signed)
Has nausea with travel for which Meclizine works well, will send script for this for her upcoming travel to [place].

## 2022-03-04 DIAGNOSIS — N390 Urinary tract infection, site not specified: Secondary | ICD-10-CM | POA: Diagnosis not present

## 2022-03-08 DIAGNOSIS — H5203 Hypermetropia, bilateral: Secondary | ICD-10-CM | POA: Diagnosis not present

## 2022-04-12 ENCOUNTER — Encounter: Payer: Self-pay | Admitting: Obstetrics and Gynecology

## 2022-04-16 ENCOUNTER — Telehealth: Payer: Self-pay | Admitting: Obstetrics and Gynecology

## 2022-04-16 DIAGNOSIS — N926 Irregular menstruation, unspecified: Secondary | ICD-10-CM

## 2022-04-16 DIAGNOSIS — N939 Abnormal uterine and vaginal bleeding, unspecified: Secondary | ICD-10-CM

## 2022-04-16 NOTE — Telephone Encounter (Signed)
This pt called in and indicated that she had been messaging back and forth with you about conception.  I have her scheduled to see you on August 10th.  But she said that she needs an [place].  Will you put in an order for the [place]?  We have availability on July 27th.

## 2022-04-17 NOTE — Telephone Encounter (Signed)
Pls send new msg to you on this re: appt and u/s order. Thx.

## 2022-04-24 ENCOUNTER — Ambulatory Visit (INDEPENDENT_AMBULATORY_CARE_PROVIDER_SITE_OTHER): Payer: BC Managed Care – PPO

## 2022-04-24 ENCOUNTER — Other Ambulatory Visit: Payer: Self-pay | Admitting: Obstetrics and Gynecology

## 2022-04-24 VITALS — BP 100/60 | HR 70 | Ht 65.0 in | Wt 162.0 lb

## 2022-04-24 DIAGNOSIS — R300 Dysuria: Secondary | ICD-10-CM

## 2022-04-24 DIAGNOSIS — R350 Frequency of micturition: Secondary | ICD-10-CM

## 2022-04-24 DIAGNOSIS — R3915 Urgency of urination: Secondary | ICD-10-CM

## 2022-04-24 LAB — POCT URINALYSIS DIPSTICK
Appearance: ABNORMAL
Bilirubin, UA: NEGATIVE
Blood, UA: 3
Glucose, UA: NEGATIVE
Ketones, UA: NEGATIVE
Nitrite, UA: UNDETERMINED
Odor: NORMAL
Protein, UA: NEGATIVE
Spec Grav, UA: 1.01 (ref 1.010–1.025)
Urobilinogen, UA: 0.2 E.U./dL
pH, UA: 6 (ref 5.0–8.0)

## 2022-04-24 MED ORDER — NITROFURANTOIN MONOHYD MACRO 100 MG PO CAPS: 100.0000 mg | ORAL_CAPSULE | Freq: Two times a day (BID) | ORAL | 0 refills | Status: AC

## 2022-04-24 NOTE — Progress Notes (Signed)
Pt with UTI sx and pos UA. Rx macrobid looks to be already Rxd.

## 2022-04-24 NOTE — Addendum Note (Signed)
Addended by: Kathlene Cote on: 04/24/2022 11:01 AM   Modules accepted: Orders

## 2022-04-24 NOTE — Progress Notes (Signed)
SUBJECTIVE: Kayla Townsend is a 36 y.o. female who complains of urinary frequency, urgency and dysuria x 1 days, without flank pain, fever, chills, or abnormal vaginal discharge or bleeding. She has taken an AZO this morning resulting in urine color: orange.  OBJECTIVE: Appears well, in no apparent distress.  Vital signs are normal.  Urine dipstick shows positive for WBC's, positive for RBC's, and positive for leukocytes.   ASSESSMENT: UTI uncomplicated without evidence of pyelonephritis  PLAN: Treatment per orders - also push fluids, may use AZO OTC prn. Call or return to clinic prn if these symptoms worsen or fail to improve as anticipated.

## 2022-04-26 ENCOUNTER — Ambulatory Visit: Payer: BC Managed Care – PPO

## 2022-04-26 LAB — URINE CULTURE

## 2022-05-04 DIAGNOSIS — D485 Neoplasm of uncertain behavior of skin: Secondary | ICD-10-CM | POA: Diagnosis not present

## 2022-05-04 DIAGNOSIS — L820 Inflamed seborrheic keratosis: Secondary | ICD-10-CM | POA: Diagnosis not present

## 2022-05-04 DIAGNOSIS — D225 Melanocytic nevi of trunk: Secondary | ICD-10-CM | POA: Diagnosis not present

## 2022-05-04 DIAGNOSIS — D2271 Melanocytic nevi of right lower limb, including hip: Secondary | ICD-10-CM | POA: Diagnosis not present

## 2022-05-04 DIAGNOSIS — D2272 Melanocytic nevi of left lower limb, including hip: Secondary | ICD-10-CM | POA: Diagnosis not present

## 2022-05-10 ENCOUNTER — Ambulatory Visit: Payer: BC Managed Care – PPO | Admitting: Obstetrics and Gynecology

## 2022-05-10 ENCOUNTER — Other Ambulatory Visit: Payer: Self-pay | Admitting: Obstetrics and Gynecology

## 2022-05-10 ENCOUNTER — Ambulatory Visit (INDEPENDENT_AMBULATORY_CARE_PROVIDER_SITE_OTHER): Payer: BC Managed Care – PPO

## 2022-05-10 ENCOUNTER — Telehealth: Payer: Self-pay | Admitting: Obstetrics and Gynecology

## 2022-05-10 DIAGNOSIS — Z3201 Encounter for pregnancy test, result positive: Secondary | ICD-10-CM

## 2022-05-10 DIAGNOSIS — N926 Irregular menstruation, unspecified: Secondary | ICD-10-CM | POA: Diagnosis not present

## 2022-05-10 NOTE — Telephone Encounter (Signed)
Pt is scheduled for [place] next Thursday, 8/17 at 8:30.

## 2022-05-10 NOTE — Telephone Encounter (Signed)
Pt needs to have [place] next Thursday, but I don't have any available spots here.  Can I push the [place] out to next Thursday, 8/24?  Or does she need to go out for the [place]?

## 2022-05-10 NOTE — Progress Notes (Signed)
Pt here today for GYN u/s for AUB for a couple months. 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 LMP 04/12/22.  Pt wants to conceive; had monthly menses in past prior to [facility].  Gyn u/s today.  ULTRASOUND REPORT   Location: [facility] Date of Service: 05/10/2022        Indications: Irregular cycles Findings:  The uterus is anteverted and measures 8.3 x 5.4 x 3.8cm. Echo texture is homogenous without evidence of focal masses.   The uterus appears to contain a septum at the fundal end. 3D imaging was performed to aid in diagnosis of uterine anomaly. The left fundal horn appears to contain an early pregnancy. CRL: [redacted]w[redacted]d, 5.26mm No cardiac activity is identified. Yolk Sac is not identified.     Right Ovary measures 3.0 x 2.3 x 2.1cm. It is normal in appearance. Left Ovary measures 2.2 x 2.0 x 1.7cm. It is normal in appearance. Survey of the adnexa demonstrates no adnexal masses. There is no free fluid in the cul de sac.   Impression: 1. 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 Early pregnancy in the left horn without cardiac activity   Recommendations: 1.Clinical correlation with the patient's History and Physical Exam.    Willette Alma, RDMS, RVT   Pt is not having any bleeding currently. Did have mild cramping yesterday. Will check serum beta Hcg today and Monday, as well as repeat u/s in 1 wk.  Discussed septum and pregnancy. Will refer to MD once viability confirmed.

## 2022-05-10 NOTE — Telephone Encounter (Signed)
Per Baxter Hire, just work her in next wk. She has to have in 1 wk. Thx.

## 2022-05-11 LAB — BETA HCG QUANT (REF LAB): hCG Quant: 647 m[IU]/mL

## 2022-05-14 ENCOUNTER — Other Ambulatory Visit: Payer: BC Managed Care – PPO

## 2022-05-14 ENCOUNTER — Other Ambulatory Visit: Payer: Self-pay | Admitting: Obstetrics and Gynecology

## 2022-05-14 DIAGNOSIS — Z3201 Encounter for pregnancy test, result positive: Secondary | ICD-10-CM | POA: Diagnosis not present

## 2022-05-15 LAB — BETA HCG QUANT (REF LAB): hCG Quant: 2190 m[IU]/mL

## 2022-05-17 ENCOUNTER — Ambulatory Visit: Payer: BC Managed Care – PPO

## 2022-05-17 DIAGNOSIS — Z3201 Encounter for pregnancy test, result positive: Secondary | ICD-10-CM

## 2022-05-22 ENCOUNTER — Ambulatory Visit: Admission: RE | Admit: 2022-05-22 | Payer: BC Managed Care – PPO | Source: Ambulatory Visit

## 2022-05-22 ENCOUNTER — Other Ambulatory Visit: Payer: Self-pay | Admitting: Obstetrics and Gynecology

## 2022-05-22 ENCOUNTER — Encounter: Payer: Self-pay | Admitting: Obstetrics and Gynecology

## 2022-05-22 ENCOUNTER — Other Ambulatory Visit (HOSPITAL_COMMUNITY): Payer: Self-pay | Admitting: Obstetrics and Gynecology

## 2022-05-22 ENCOUNTER — Ambulatory Visit: Payer: BC Managed Care – PPO | Attending: Obstetrics and Gynecology

## 2022-05-22 DIAGNOSIS — Z3A01 Less than 8 weeks gestation of pregnancy: Secondary | ICD-10-CM | POA: Diagnosis not present

## 2022-05-22 DIAGNOSIS — O3680X0 Pregnancy with inconclusive fetal viability, not applicable or unspecified: Secondary | ICD-10-CM

## 2022-05-22 DIAGNOSIS — O09511 Supervision of elderly primigravida, first trimester: Secondary | ICD-10-CM | POA: Diagnosis not present

## 2022-05-22 NOTE — Telephone Encounter (Signed)
Called pt.

## 2022-05-27 ENCOUNTER — Encounter: Payer: Self-pay | Admitting: Obstetrics and Gynecology

## 2022-05-28 ENCOUNTER — Other Ambulatory Visit: Payer: BC Managed Care – PPO

## 2022-06-01 DIAGNOSIS — O039 Complete or unspecified spontaneous abortion without complication: Secondary | ICD-10-CM

## 2022-06-01 HISTORY — DX: Complete or unspecified spontaneous abortion without complication: O03.9

## 2022-06-03 NOTE — Patient Instructions (Incomplete)

## 2022-06-05 ENCOUNTER — Ambulatory Visit (INDEPENDENT_AMBULATORY_CARE_PROVIDER_SITE_OTHER): Payer: BC Managed Care – PPO | Admitting: Nurse Practitioner

## 2022-06-05 ENCOUNTER — Encounter: Payer: Self-pay | Admitting: Nurse Practitioner

## 2022-06-05 VITALS — BP 93/61 | HR 80 | Temp 98.6°F | Ht 65.5 in | Wt 160.5 lb

## 2022-06-05 DIAGNOSIS — Z136 Encounter for screening for cardiovascular disorders: Secondary | ICD-10-CM

## 2022-06-05 DIAGNOSIS — E559 Vitamin D deficiency, unspecified: Secondary | ICD-10-CM

## 2022-06-05 DIAGNOSIS — Z0000 Encounter for general adult medical examination without abnormal findings: Secondary | ICD-10-CM

## 2022-06-05 DIAGNOSIS — Z13220 Encounter for screening for lipoid disorders: Secondary | ICD-10-CM | POA: Diagnosis not present

## 2022-06-05 NOTE — Progress Notes (Signed)
BP 93/61   Pulse 80   Temp 98.6 F (37 C) (Oral)   Ht 5' 5.5" (1.664 m)   Wt 160 lb 8 oz (72.8 kg)   SpO2 99%   BMI 26.30 kg/m    Subjective:    Patient ID: Kayla Townsend, female    DOB: November 07, 1985, 36 y.o.   MRN: [id number]  HPI: Kayla Townsend is a 36 y.o. female presenting on 06/05/2022 for comprehensive medical examination. Current medical complaints include:none  She currently lives with: significant other Menopausal Symptoms: no     06/05/2022    8:50 AM 06/01/2021   10:54 AM 05/19/2020   10:29 AM  Depression screen PHQ 2/9  Decreased Interest 0 0 0  Down, Depressed, Hopeless 0 0 0  PHQ - 2 Score 0 0 0  Altered sleeping 0    Tired, decreased energy 0    Change in appetite 0    Feeling bad or failure about yourself  0    Trouble concentrating 0    Moving slowly or fidgety/restless 0    Suicidal thoughts 0    PHQ-9 Score 0    Difficult doing work/chores Not difficult at all          01/06/2019    2:30 PM 06/05/2022    8:50 AM 06/05/2022    9:10 AM  Fall Risk  Falls in the past year?  0 0  Was there an injury with Fall?  0 0  Fall Risk Category Calculator  0 0  Fall Risk Category  Low Low  Patient Fall Risk Level Low fall risk Low fall risk Low fall risk  Patient at Risk for Falls Due to  No Fall Risks No Fall Risks  Fall risk Follow up  Falls evaluation completed Education provided    Functional Status Survey: Is the patient deaf or have difficulty hearing?: No Does the patient have difficulty seeing, even when wearing glasses/contacts?: No Does the patient have difficulty concentrating, remembering, or making decisions?: No Does the patient have difficulty walking or climbing stairs?: No Does the patient have difficulty dressing or bathing?: No Does the patient have difficulty doing errands alone such as visiting a doctor's office or shopping?: No    Past Medical History:  Past Medical History:  Diagnosis Date   Abnormal Pap smear of  vagina    Allergy    Anxiety    Cervical high risk human papillomavirus (HPV) DNA test positive 03/2018   Cholelithiases 03/2018   on renal u/s, no sx   Kidney stone     Surgical History:  Past Surgical History:  Procedure Laterality Date   WISDOM TOOTH EXTRACTION      Medications:  Current Outpatient Medications on File Prior to Visit  Medication Sig   Cholecalciferol (VITAMIN D3) 25 MCG (1000 UT) CAPS Take 1 capsule by mouth daily.   Prenatal Vit-Fe Fumarate-FA (PRENATAL MULTIVITAMIN) TABS tablet Take 1 tablet by mouth daily at 12 noon.   triamcinolone cream (KENALOG) 0.1 % Apply 1 application topically 2 (two) times daily.   No current facility-administered medications on file prior to visit.    Allergies:  Allergies  Allergen Reactions   Ceclor [Cefaclor] Swelling    As a child    Social History:  Social History   Socioeconomic History   Marital status: Married    Spouse name: Not on file   Number of children: Not on file   Years of education: Not on file  Highest education level: Not on file  Occupational History   Not on file  Tobacco Use   Smoking status: Never   Smokeless tobacco: Never  Vaping Use   Vaping Use: Never used  Substance and Sexual Activity   Alcohol use: Yes    Comment: occasional   Drug use: No   Sexual activity: Yes    Birth control/protection: Pill  Other Topics Concern   Not on file  Social History Narrative   Not on file   Social Determinants of Health   Financial Resource Strain: Low Risk  (04/27/2021)   Overall Financial Resource Strain (CARDIA)    Difficulty of Paying Living Expenses: Not hard at all  Food Insecurity: No Food Insecurity (04/27/2021)   Hunger Vital Sign    Worried About Running Out of Food in the Last Year: Never true    Ran Out of Food in the Last Year: Never true  Transportation Needs: No Transportation Needs (04/27/2021)   PRAPARE - Administrator, Civil Service (Medical): No    Lack of  Transportation (Non-Medical): No  Physical Activity: Sufficiently Active (04/27/2021)   Exercise Vital Sign    Days of Exercise per Week: 5 days    Minutes of Exercise per Session: 60 min  Stress: No Stress Concern Present (04/27/2021)   [employer] of Occupational Health - Occupational Stress Questionnaire    Feeling of Stress : Not at all  Social Connections: Moderately Integrated (04/27/2021)   Social Connection and Isolation Panel [NHANES]    Frequency of Communication with Friends and Family: More than three times a week    Frequency of Social Gatherings with Friends and Family: More than three times a week    Attends Religious Services: More than 4 times per year    Active Member of [employer] or Organizations: No    Attends Banker Meetings: Never    Marital Status: Married  Catering manager Violence: Not At Risk (04/27/2021)   Humiliation, Afraid, Rape, and Kick questionnaire    Fear of Current or Ex-Partner: No    Emotionally Abused: No    Physically Abused: No    Sexually Abused: No   Social History   Tobacco Use  Smoking Status Never  Smokeless Tobacco Never   Social History   Substance and Sexual Activity  Alcohol Use Yes   Comment: occasional    Family History:  Family History  Problem Relation Age of Onset   Hyperlipidemia Mother    Anxiety disorder Mother    Stroke Father 42   Dementia Father    Diabetes Father    Bipolar disorder Father    Anxiety disorder Brother    Diabetes Maternal Grandfather    Alzheimer's disease Paternal Grandmother    Lung cancer Paternal Grandfather    Ovarian cancer Neg Hx     Past medical history, surgical history, medications, allergies, family history and social history reviewed with patient today and changes made to appropriate areas of the chart.   ROS All other ROS negative except what is listed above and in the HPI.      Objective:    BP 93/61   Pulse 80   Temp 98.6 F (37 C) (Oral)   Ht 5'  5.5" (1.664 m)   Wt 160 lb 8 oz (72.8 kg)   SpO2 99%   BMI 26.30 kg/m   Wt Readings from Last 3 Encounters:  06/05/22 160 lb 8 oz (72.8 kg)  04/24/22 162 lb (  73.5 kg)  06/08/21 160 lb (72.6 kg)    Physical Exam Vitals and nursing note reviewed. Exam conducted with a chaperone present.  Constitutional:      General: She is awake. She is not in acute distress.    Appearance: She is well-developed. She is not ill-appearing.  HENT:     Head: Normocephalic and atraumatic.     Right Ear: Hearing, tympanic membrane, ear canal and external ear normal. No drainage.     Left Ear: Hearing, tympanic membrane, ear canal and external ear normal. No drainage.     Nose: Nose normal.     Right Sinus: No maxillary sinus tenderness or frontal sinus tenderness.     Left Sinus: No maxillary sinus tenderness or frontal sinus tenderness.     Mouth/Throat:     Mouth: Mucous membranes are moist.     Pharynx: Oropharynx is clear. Uvula midline. No pharyngeal swelling, oropharyngeal exudate or posterior oropharyngeal erythema.  Eyes:     General: Lids are normal.        Right eye: No discharge.        Left eye: No discharge.     Extraocular Movements: Extraocular movements intact.     Conjunctiva/sclera: Conjunctivae normal.     Pupils: Pupils are equal, round, and reactive to light.     Visual Fields: Right eye visual fields normal and left eye visual fields normal.  Neck:     Thyroid: No thyromegaly.     Vascular: No carotid bruit.     Trachea: Trachea normal.  Cardiovascular:     Rate and Rhythm: Normal rate and regular rhythm.     Heart sounds: Normal heart sounds. No murmur heard.    No gallop.  Pulmonary:     Effort: Pulmonary effort is normal. No accessory muscle usage or respiratory distress.     Breath sounds: Normal breath sounds.  Abdominal:     General: Bowel sounds are normal.     Palpations: Abdomen is soft. There is no hepatomegaly or splenomegaly.     Tenderness: There is no  abdominal tenderness.  Musculoskeletal:        General: Normal range of motion.     Cervical back: Normal range of motion and neck supple.     Right lower leg: No edema.     Left lower leg: No edema.  Lymphadenopathy:     Head:     Right side of head: No submental, submandibular, tonsillar, preauricular or posterior auricular adenopathy.     Left side of head: No submental, submandibular, tonsillar, preauricular or posterior auricular adenopathy.     Cervical: No cervical adenopathy.  Skin:    General: Skin is warm and dry.     Capillary Refill: Capillary refill takes less than 2 seconds.     Findings: No rash.  Neurological:     Mental Status: She is alert and oriented to person, place, and time.     Gait: Gait is intact.     Deep Tendon Reflexes: Reflexes are normal and symmetric.     Reflex Scores:      Brachioradialis reflexes are 2+ on the right side and 2+ on the left side.      Patellar reflexes are 2+ on the right side and 2+ on the left side. Psychiatric:        Attention and Perception: Attention normal.        Mood and Affect: Mood normal.        Speech:  Speech normal.        Behavior: Behavior normal. Behavior is cooperative.        Thought Content: Thought content normal.        Judgment: Judgment normal.    Results for orders placed or performed in visit on 05/14/22  Beta hCG quant (ref lab)  Result Value Ref Range   hCG Quant 2,190 mIU/mL      Assessment & Plan:   Problem List Items Addressed This Visit   None Visit Diagnoses     Encounter for annual physical exam    -  Primary   Annual physical today with labs and health maintenance reviewed, discussed with patient.   Relevant Orders   CBC with Differential/Platelet   TSH   Vitamin D deficiency       History of low levels reported, check today and initiate supplement as needed.   Relevant Orders   VITAMIN D 25 Hydroxy (Vit-D Deficiency, Fractures)   Encounter for lipid screening for cardiovascular  disease       Lipid panel on labs today.   Relevant Orders   Comprehensive metabolic panel   Lipid Panel w/o Chol/HDL Ratio        Follow up plan: Return in about 1 year (around 06/06/2023) for Annual physical.   LABORATORY TESTING:  - Pap smear: up to date  IMMUNIZATIONS:   - Tdap: Tetanus vaccination status reviewed: last tetanus booster within 10 years. - Influenza: Refused - Pneumovax: Not applicable - Prevnar: Not applicable - COVID: Up to date - HPV: Not applicable - Shingrix vaccine: Not applicable  SCREENING: -Mammogram: Not applicable  - Colonoscopy: Not applicable  - Bone Density: Not applicable  -Hearing Test: Not applicable  -Spirometry: Not applicable   PATIENT COUNSELING:   Advised to take 1 mg of folate supplement per day if capable of pregnancy.   Sexuality: Discussed sexually transmitted diseases, partner selection, use of condoms, avoidance of unintended pregnancy  and contraceptive alternatives.   Advised to avoid cigarette smoking.  I discussed with the patient that most people either abstain from alcohol or drink within safe limits (<=14/week and <=4 drinks/occasion for males, <=7/weeks and <= 3 drinks/occasion for females) and that the risk for alcohol disorders and other health effects rises proportionally with the number of drinks per week and how often a drinker exceeds daily limits.  Discussed cessation/primary prevention of drug use and availability of treatment for abuse.   Diet: Encouraged to adjust caloric intake to maintain  or achieve ideal body weight, to reduce intake of dietary saturated fat and total fat, to limit sodium intake by avoiding high sodium foods and not adding table salt, and to maintain adequate dietary potassium and calcium preferably from fresh fruits, vegetables, and low-fat dairy products.    Stressed the importance of regular exercise  Injury prevention: Discussed safety belts, safety helmets, smoke detector, smoking  near bedding or upholstery.   Dental health: Discussed importance of regular tooth brushing, flossing, and dental visits.    NEXT PREVENTATIVE PHYSICAL DUE IN 1 YEAR. Return in about 1 year (around 06/06/2023) for Annual physical.

## 2022-06-06 LAB — COMPREHENSIVE METABOLIC PANEL
ALT: 17 IU/L (ref 0–32)
AST: 21 IU/L (ref 0–40)
Albumin/Globulin Ratio: 1.8 (ref 1.2–2.2)
Albumin: 4.2 g/dL (ref 3.9–4.9)
Alkaline Phosphatase: 57 IU/L (ref 44–121)
BUN/Creatinine Ratio: 14 (ref 9–23)
BUN: 10 mg/dL (ref 6–20)
Bilirubin Total: 0.3 mg/dL (ref 0.0–1.2)
CO2: 22 mmol/L (ref 20–29)
Calcium: 9.8 mg/dL (ref 8.7–10.2)
Chloride: 102 mmol/L (ref 96–106)
Creatinine, Ser: 0.73 mg/dL (ref 0.57–1.00)
Globulin, Total: 2.4 g/dL (ref 1.5–4.5)
Glucose: 81 mg/dL (ref 70–99)
Potassium: 4 mmol/L (ref 3.5–5.2)
Sodium: 138 mmol/L (ref 134–144)
Total Protein: 6.6 g/dL (ref 6.0–8.5)
eGFR: 111 mL/min/{1.73_m2} (ref >59)

## 2022-06-06 LAB — CBC WITH DIFFERENTIAL/PLATELET
Basophils Absolute: 0 10*3/uL (ref 0.0–0.2)
Basos: 1 %
EOS (ABSOLUTE): 0.1 10*3/uL (ref 0.0–0.4)
Eos: 2 %
Hematocrit: 42.6 % (ref 34.0–46.6)
Hemoglobin: 14.7 g/dL (ref 11.1–15.9)
Immature Grans (Abs): 0 10*3/uL (ref 0.0–0.1)
Immature Granulocytes: 0 %
Lymphocytes Absolute: 1.3 10*3/uL (ref 0.7–3.1)
Lymphs: 24 %
MCH: 31.8 pg (ref 26.6–33.0)
MCHC: 34.5 g/dL (ref 31.5–35.7)
MCV: 92 fL (ref 79–97)
Monocytes Absolute: 0.5 10*3/uL (ref 0.1–0.9)
Monocytes: 9 %
Neutrophils Absolute: 3.4 10*3/uL (ref 1.4–7.0)
Neutrophils: 64 %
Platelets: 220 10*3/uL (ref 150–450)
RBC: 4.62 x10E6/uL (ref 3.77–5.28)
RDW: 12.6 % (ref 11.7–15.4)
WBC: 5.2 10*3/uL (ref 3.4–10.8)

## 2022-06-06 LAB — VITAMIN D 25 HYDROXY (VIT D DEFICIENCY, FRACTURES): Vit D, 25-Hydroxy: 31.1 ng/mL (ref 30.0–100.0)

## 2022-06-06 LAB — LIPID PANEL W/O CHOL/HDL RATIO
Cholesterol, Total: 161 mg/dL (ref 100–199)
HDL: 74 mg/dL (ref >39)
LDL Chol Calc (NIH): 78 mg/dL (ref 0–99)
Triglycerides: 37 mg/dL (ref 0–149)
VLDL Cholesterol Cal: 9 mg/dL (ref 5–40)

## 2022-06-06 LAB — TSH: TSH: 0.943 u[IU]/mL (ref 0.450–4.500)

## 2022-06-06 NOTE — Progress Notes (Signed)
Contacted via MyChart   Good afternoon Rianna, your labs have returned and are all normal.  Great news!!  No changes needed, very healthy labs going into pregnancy:) I am so excited!!! Keep being amazing!!  Thank you for allowing me to participate in your care.  I appreciate you. Kindest regards, Serrita Lueth

## 2022-06-13 ENCOUNTER — Other Ambulatory Visit: Payer: Self-pay

## 2022-06-13 ENCOUNTER — Telehealth: Payer: Self-pay

## 2022-06-13 ENCOUNTER — Emergency Department: Payer: BC Managed Care – PPO

## 2022-06-13 ENCOUNTER — Encounter: Payer: Self-pay | Admitting: *Deleted

## 2022-06-13 ENCOUNTER — Emergency Department
Admission: EM | Admit: 2022-06-13 | Discharge: 2022-06-13 | Disposition: A | Discharge status: Home / Self Care | Payer: BC Managed Care – PPO | Attending: Emergency Medicine | Admitting: Emergency Medicine

## 2022-06-13 ENCOUNTER — Ambulatory Visit (INDEPENDENT_AMBULATORY_CARE_PROVIDER_SITE_OTHER): Payer: BC Managed Care – PPO

## 2022-06-13 VITALS — Wt 160.0 lb

## 2022-06-13 DIAGNOSIS — O039 Complete or unspecified spontaneous abortion without complication: Secondary | ICD-10-CM

## 2022-06-13 DIAGNOSIS — Z8639 Personal history of other endocrine, nutritional and metabolic disease: Secondary | ICD-10-CM

## 2022-06-13 DIAGNOSIS — Z3A00 Weeks of gestation of pregnancy not specified: Secondary | ICD-10-CM | POA: Diagnosis not present

## 2022-06-13 DIAGNOSIS — Z3480 Encounter for supervision of other normal pregnancy, unspecified trimester: Secondary | ICD-10-CM

## 2022-06-13 DIAGNOSIS — O209 Hemorrhage in early pregnancy, unspecified: Secondary | ICD-10-CM | POA: Diagnosis not present

## 2022-06-13 DIAGNOSIS — O09511 Supervision of elderly primigravida, first trimester: Secondary | ICD-10-CM

## 2022-06-13 DIAGNOSIS — Z3A01 Less than 8 weeks gestation of pregnancy: Secondary | ICD-10-CM

## 2022-06-13 DIAGNOSIS — Z369 Encounter for antenatal screening, unspecified: Secondary | ICD-10-CM

## 2022-06-13 DIAGNOSIS — O3680X0 Pregnancy with inconclusive fetal viability, not applicable or unspecified: Secondary | ICD-10-CM

## 2022-06-13 LAB — URINALYSIS, ROUTINE W REFLEX MICROSCOPIC: Specific Gravity, Urine: 1.023 (ref 1.005–1.030)

## 2022-06-13 LAB — COMPREHENSIVE METABOLIC PANEL
ALT: 20 U/L (ref 0–44)
AST: 23 U/L (ref 15–41)
Albumin: 4.4 g/dL (ref 3.5–5.0)
Alkaline Phosphatase: 50 U/L (ref 38–126)
Anion gap: 10 (ref 5–15)
BUN: 12 mg/dL (ref 6–20)
CO2: 24 mmol/L (ref 22–32)
Calcium: 9.4 mg/dL (ref 8.9–10.3)
Chloride: 106 mmol/L (ref 98–111)
Creatinine, Ser: 0.68 mg/dL (ref 0.44–1.00)
GFR, Estimated: >60 mL/min (ref >60)
Glucose, Bld: 90 mg/dL (ref 70–99)
Potassium: 3.4 mmol/L — ABNORMAL LOW (ref 3.5–5.1)
Sodium: 140 mmol/L (ref 135–145)
Total Bilirubin: 0.7 mg/dL (ref 0.3–1.2)
Total Protein: 7.9 g/dL (ref 6.5–8.1)

## 2022-06-13 LAB — CBC
HCT: 43.2 % (ref 36.0–46.0)
Hemoglobin: 15.1 g/dL — ABNORMAL HIGH (ref 12.0–15.0)
MCH: 31.1 pg (ref 26.0–34.0)
MCHC: 35 g/dL (ref 30.0–36.0)
MCV: 89.1 fL (ref 80.0–100.0)
Platelets: 208 10*3/uL (ref 150–400)
RBC: 4.85 MIL/uL (ref 3.87–5.11)
RDW: 12.3 % (ref 11.5–15.5)
WBC: 10.1 10*3/uL (ref 4.0–10.5)
nRBC: 0 % (ref 0.0–0.2)

## 2022-06-13 LAB — URINALYSIS, MICROSCOPIC (REFLEX): RBC / HPF: >50 RBC/hpf (ref 0–5)

## 2022-06-13 LAB — HCG, QUANTITATIVE, PREGNANCY: hCG, Beta Chain, Quant, S: 7316 m[IU]/mL — ABNORMAL HIGH (ref <5)

## 2022-06-13 LAB — ABO/RH: ABO/RH(D): A POS

## 2022-06-13 LAB — POC URINE PREG, ED: Preg Test, Ur: POSITIVE — AB

## 2022-06-13 NOTE — ED Provider Notes (Signed)
   [facility] Provider Note    Event Date/Time   First MD Initiated Contact with Patient 06/13/22 2126     (approximate)  History   Chief Complaint: Vaginal Bleeding  HPI  Kayla Townsend is a 36 y.o. female with a past medical history of anxiety, and G1, P0 presents to the emergency department around [redacted] weeks pregnant for vaginal bleeding and possible miscarriage.  According to the patient over the past 4 days she has had intermittent vaginal spotting and bleeding.  She was scheduled to see her OB tomorrow however this evening she began with heavier bleeding and cramping and passed what appears to be tissue so the patient came to the emergency department for evaluation.  Patient has brought what appears to be products of conception with her on my evaluation.  Patient states mild cramping currently improved from earlier.  Patient did have an ultrasound performed at 6 weeks as well as 8 weeks that did show a heartbeat 1 week ago per patient.  Physical Exam   Triage Vital Signs: ED Triage Vitals  Enc Vitals Group     BP 06/13/22 2013 127/89     Pulse Rate 06/13/22 2013 76     Resp 06/13/22 2013 16     Temp 06/13/22 2013 98.8 F (37.1 C)     Temp Source 06/13/22 2013 Oral     SpO2 06/13/22 2013 95 %     Weight 06/13/22 2014 161 lb (73 kg)     Height 06/13/22 2014 5\' 5"  (1.651 m)     Head Circumference --      Peak Flow --      Pain Score 06/13/22 2014 4     Pain Loc --      Pain Edu? --      Excl. in GC? --     Most recent vital signs: Vitals:   06/13/22 2013  BP: 127/89  Pulse: 76  Resp: 16  Temp: 98.8 F (37.1 C)  SpO2: 95%    General: Awake, no distress.  CV:  Good peripheral perfusion.  Regular rate and rhythm  Resp:  Normal effort.  Equal breath sounds bilaterally.  Abd:  No distention.  Soft, nontender.  No rebound or guarding.   ED Results / Procedures / Treatments   RADIOLOGY  Ultrasound confirms no pregnancy in the  uterus.   MEDICATIONS ORDERED IN ED: Medications - No data to display   IMPRESSION / MDM / ASSESSMENT AND PLAN / ED COURSE  I reviewed the triage vital signs and the nursing notes.  Patient's presentation is most consistent with acute presentation with potential threat to life or bodily function.  Patient presents emergency department for vaginal bleeding [redacted] weeks pregnant now with what appears to be passing products of conception.  Symptoms are very suggestive of likely completed miscarriage.  Patient states very minimal cramping currently.  Patient's lab work is reassuring including normal CBC with a normal H&H, normal chemistry and a positive blood type no need for RhoGAM.  Patient's ultrasound is pending, beta-hCG pending.  Ultrasound shows no pregnancy in the uterus.  Suspect completed miscarriage.  Patient will follow-up with her OB tomorrow.  Patient's beta-hCG has resulted at 7316.  FINAL CLINICAL IMPRESSION(S) / ED DIAGNOSES   Miscarriage   Note:  This document was prepared using Dragon voice recognition software and may include unintentional dictation errors.   06/15/22, MD 06/13/22 2231

## 2022-06-13 NOTE — ED Triage Notes (Addendum)
Pt reports abd pain with cramping and bleeding   pt reports bleeding began 4 days ago after sexual intercourse.  Bleeding and cramping worse today at 1700.   pt is approx [redacted] weeks pregnant.  Pt states she thinks she had a miscarriage.  Pt has possible poc with her in a container.  Pt tearful.  Pt alert  speech clear.

## 2022-06-13 NOTE — Progress Notes (Signed)
New OB Intake  I connected with  Kayla Townsend on 06/13/22 at  1:15 PM EDT by telephone and verified that I am speaking with the correct person using two identifiers. Nurse is located at [facility] and pt is located at parking lot.  I explained I am completing New OB Intake today. We discussed her EDD of 01/17/2023 that is based on LMP of 04/12/2022. Pt is G1/P0. I reviewed her allergies, medications, Medical/Surgical/OB history, and appropriate screenings. Based on history, this is a/an pregnancy uncomplicated .   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 Pt states had IC Sunday and has spotting; adv nl as cx is easy to make bleed when pregnant; to be seen if becomes like a period.  Delivery Plans:  Plans to deliver at [facility].  Anatomy US Explained will order a f/u viability scan as cardiac activity not seen and an anatomy scan will be done at 20 weeks.  Labs Discussed genetic screening with patient. Patient unsure about getting genetic testing. Discussed possible labs to be drawn at new OB appointment.  COVID Vaccine Patient has had COVID vaccine.   Social Determinants of Health Food Insecurity: denies food insecurity Transportation: Patient denies transportation needs.  First visit review I reviewed new OB appt with pt. I explained she will have ob bloodwork and pap smear/pelvic exam if indicated. Explained pt will be seen by Paula Compton, CNM at first visit; encounter routed to appropriate provider.      Loran Senters, [facility] 06/13/2022  1:57 PM

## 2022-06-13 NOTE — Discharge Instructions (Signed)
Please follow-up with your OB GYN tomorrow as scheduled.  Return to the emergency department for any significant abdominal pain, any fever or any other symptom personally concerning to yourself.  Your pregnancy hormone level today is 7316 (your OB/GYN will likely need to know this).

## 2022-06-14 ENCOUNTER — Encounter: Payer: Self-pay | Admitting: Licensed Practical Nurse

## 2022-06-14 ENCOUNTER — Ambulatory Visit (INDEPENDENT_AMBULATORY_CARE_PROVIDER_SITE_OTHER): Payer: BC Managed Care – PPO | Admitting: Licensed Practical Nurse

## 2022-06-14 VITALS — BP 130/80 | Wt 161.0 lb

## 2022-06-14 DIAGNOSIS — O039 Complete or unspecified spontaneous abortion without complication: Secondary | ICD-10-CM

## 2022-06-14 NOTE — Progress Notes (Unsigned)
SUBJECTIVE: Her with partner, Kayla Townsend.  Was seen yesterday in the ED for a complete SAB.  US showed nothing in the uterus.  Kayla Townsend reports she is bleeding like a period with some cramping.  Kayla Townsend had an [place] early in the pregnancy that showed a septum, wonders if this is something to be concerned about.   OBJECTIVE: BP 130/80   Wt 161 lb (73 kg)   LMP 04/12/2022 (Exact Date)   BMI 26.79 kg/m  Gen: appropriately tearful PE deferred as Kayla Townsend was evaluated yesterday  BHCG on 9/13: 7,316  US findings on 9/13: IMPRESSION: 1. Thickened endometrium without evidence of an intrauterine pregnancy. 2. Nonvisualization of the right ovary.    ASSESSMENT: Complete SAB  PLAN: -will review [place] and discuss with MD recommendation for uterine septum, will sen dmychart message with a plan -return in 2 weeks for in person visit and labs, will need to follow Beta to less than 5  [name], CNM  [facility]  06/14/22  9:28 AM   Addendum: Reviewed [place] from 05/10/22 with Dr Valentino Saxon "Uterus appears to contain a septum" no specific measurement given. Per Dr Valentino Saxon most likely septum is not significant, a repeat US is not needed. The pt's miscarriage is most likely not related to the septum. Mychart message to pt [name], CNM  [name], [facility]  06/15/22  4:09 PM

## 2022-06-15 ENCOUNTER — Encounter: Payer: Self-pay | Admitting: Licensed Practical Nurse

## 2022-06-25 ENCOUNTER — Other Ambulatory Visit: Payer: BC Managed Care – PPO

## 2022-06-26 ENCOUNTER — Ambulatory Visit (INDEPENDENT_AMBULATORY_CARE_PROVIDER_SITE_OTHER): Payer: BC Managed Care – PPO | Admitting: Licensed Practical Nurse

## 2022-06-26 VITALS — BP 122/70 | Ht 65.0 in | Wt 165.0 lb

## 2022-06-26 DIAGNOSIS — Z3169 Encounter for other general counseling and advice on procreation: Secondary | ICD-10-CM | POA: Diagnosis not present

## 2022-06-26 DIAGNOSIS — O039 Complete or unspecified spontaneous abortion without complication: Secondary | ICD-10-CM | POA: Diagnosis not present

## 2022-06-27 ENCOUNTER — Encounter: Payer: Self-pay | Admitting: Licensed Practical Nurse

## 2022-06-27 ENCOUNTER — Other Ambulatory Visit: Payer: Self-pay | Admitting: Licensed Practical Nurse

## 2022-06-27 DIAGNOSIS — O039 Complete or unspecified spontaneous abortion without complication: Secondary | ICD-10-CM

## 2022-06-27 LAB — VARICELLA ZOSTER ANTIBODY, IGG: Varicella zoster IgG: 696 index (ref >165)

## 2022-06-27 LAB — BETA HCG QUANT (REF LAB): hCG Quant: 15 m[IU]/mL

## 2022-06-27 LAB — RUBELLA SCREEN: Rubella Antibodies, IGG: <0.9 index — ABNORMAL LOW (ref >0.99)

## 2022-06-27 NOTE — Progress Notes (Signed)
Pt seen for SAB fu.  Beta 15, need to follow to less than 5.  Called pt and LVM and mychart message sent.  Order placed for repeat Beta in 1 week Roberto Scales, [place]  06/27/22  6:13 PM

## 2022-06-29 ENCOUNTER — Encounter: Payer: Self-pay | Admitting: Nurse Practitioner

## 2022-06-29 NOTE — Progress Notes (Signed)
SUBJECTIVE: Here for SAB follow up. Had bleeding x 10 days, it ended on Friday.  -Questions answered regarding uterine septum noted on [place], given that it seems small it most likely should not interfere with her ability to get pregnant. -Works for [employer] and travels a lot, questions answered about traveling in Pregnancy-advised to go to a travel clinic for advice as well -Considering pregnancy in the near future, had chicken pox as a child, had the MMR as a child. Reviewed carrier and NIPT screening  General early pregnancy questions answered.  OBJECTIVE: GEN: NAD PE not necessary at this time   ASSESSMENT: SAB fu Preconception counseling  PLAN: -BHCG today, will need to follow until less than 5  -will check varicella and rubella -continue PNV with folic acid  -once pregnant, may consider serial beta's and or early Korea at 6-7 weeks.   Roberto Scales, CNM  Mosetta Pigeon, [facility]  06/29/22  10:38 AM

## 2022-07-02 ENCOUNTER — Encounter: Payer: BC Managed Care – PPO | Admitting: Obstetrics

## 2022-07-03 ENCOUNTER — Ambulatory Visit (INDEPENDENT_AMBULATORY_CARE_PROVIDER_SITE_OTHER): Payer: BC Managed Care – PPO

## 2022-07-03 ENCOUNTER — Other Ambulatory Visit: Payer: Self-pay | Admitting: Nurse Practitioner

## 2022-07-03 ENCOUNTER — Encounter: Payer: Self-pay | Admitting: Nurse Practitioner

## 2022-07-03 DIAGNOSIS — Z23 Encounter for immunization: Secondary | ICD-10-CM

## 2022-07-03 DIAGNOSIS — Z2839 Other underimmunization status: Secondary | ICD-10-CM

## 2022-07-03 DIAGNOSIS — O09899 Supervision of other high risk pregnancies, unspecified trimester: Secondary | ICD-10-CM

## 2022-09-26 ENCOUNTER — Ambulatory Visit: Payer: Self-pay | Admitting: *Deleted

## 2022-09-26 DIAGNOSIS — R519 Headache, unspecified: Secondary | ICD-10-CM | POA: Diagnosis not present

## 2022-09-26 DIAGNOSIS — Z20822 Contact with and (suspected) exposure to covid-19: Secondary | ICD-10-CM | POA: Diagnosis not present

## 2022-09-26 DIAGNOSIS — J0110 Acute frontal sinusitis, unspecified: Secondary | ICD-10-CM | POA: Diagnosis not present

## 2022-09-26 NOTE — Telephone Encounter (Signed)
Summary: Headache and vomiting   Pt called reporting headache and vomiting, seeking advice           Chief Complaint: Headache Symptoms: 7/10 headache "Band on top of head." Vomited x 2 this Am. States headache 7/10 before vomiting, 2/10 after. "Feels better after I throw up." Eyes watery, frontal headache when started Monday. Frequency: Monday Pertinent Negatives: Patient denies cough, congestion, fever Disposition: [] ED /[x] Urgent Care (no appt availability in office) / [] Appointment(In office/virtual)/ []  [place] [facility]/ [] Home Care/ [] Refused Recommended Disposition /[] [place] [facility]/ []  Follow-up with PCP Additional Notes: Advised UC. Care advise provided, states will follow disposition. Reason for Disposition  [1] Vomiting AND [2] 2 or more times  (Exception: Similar to previous migraines.)  Answer Assessment - Initial Assessment Questions 1. LOCATION: "Where does it hurt?"      Between eyes yesterday, top of head "Like a band today." 2. ONSET: "When did the headache start?" (Minutes, hours or days)      Monday 3. PATTERN: "Does the pain come and go, or has it been constant since it started?"     Comes and goes 4. SEVERITY: "How bad is the pain?" and "What does it keep you from doing?"  (e.g., Scale 1-10; mild, moderate, or severe)   - MILD (1-3): doesn't interfere with normal activities    - MODERATE (4-7): interferes with normal activities or awakens from sleep    - SEVERE (8-10): excruciating pain, unable to do any normal activities        2-7/10 5. RECURRENT SYMPTOM: "Have you ever had headaches before?" If Yes, ask: "When was the last time?" and "What happened that time?"      Unsure 6. CAUSE: "What do you think is causing the headache?"     Unsure 7. MIGRAINE: "Have you been diagnosed with migraine headaches?" If Yes, ask: "Is this headache similar?"      no 8. HEAD INJURY: "Has there been any recent injury to the head?"      no 9. OTHER SYMPTOMS:  "Do you have any other symptoms?" (fever, stiff neck, eye pain, sore throat, cold symptoms)     Sinus pressure, eyes watery,nausea and vomiting x 2 today "Headache feels better after vomiting."  Protocols used: Headache-A-AH

## 2022-09-26 NOTE — Telephone Encounter (Signed)
Noted  

## 2022-09-27 ENCOUNTER — Ambulatory Visit: Payer: BC Managed Care – PPO | Admitting: Physician Assistant

## 2022-10-01 NOTE — L&D Delivery Note (Addendum)
Delivery Note  Kayla Townsend is a G2P0010 at [redacted]w[redacted]d with an LMP of 10/16/22, consistent with [place] at [redacted]w[redacted]d.   First Stage: Labor onset: 1532 Induction: misoprostol, oxytocin, AROM, and cervical balloon Analgesia /Anesthesia intrapartum: epidural AROM at 1644 GBS: negative IP Antibiotics: none  Second Stage: Complete dilation at 2150 Onset of pushing at 2304 FHR second stage 135 bpm with moderate variability with variable decels with pushing   [name] presented to L&D with Preeclampsia with severe features. She was closed. She progressed  to C/C/+2 with a spontaneous urge to push.  She pushed  effectively over approximately 1 hour 54 minutes for a spontaneous vaginal birth.  Delivery of a viable baby girl on 07/04/23 at 0057 by CNM Delivery of fetal head in OA position with restitution to LOT. no nuchal cord;  Anterior then posterior shoulders delivered easily with gentle downward traction. Baby placed on mom's chest, and attended to by baby RN Cord double clamped after cessation of pulsation, cut by FOB  Cord blood sample collection: Not Indicated A POS Collection of cord blood donation no Arterial cord blood sample  no  Third Stage: Oxytocin bolus started after delivery of infant, TXA and Cytotec for hemorrhage prophylaxis  Placenta delivered duncan intact with 3 VC @ 0105 Placenta disposition: pathology Uterine tone firm / bleeding brisk  Bilateral labial laceration identified  Anesthesia for repair: epidural, 1% Lidocaine Repair 3-0 vicryl Est. Blood Loss (mL): 750  Complications: placenta delivered Duncan mechanism, manual sweep of uterus done, small pieces of placenta manually removed  Mom to postpartum.  Baby to Couplet care / Skin to Skin.  Newborn: Information for the patient's newborn:  Dede, Dobesh [[id number]]  Live born female  Birth Weight:   APGAR: 8, 9  Newborn Delivery   Birth date/time: 07/[birth year]/2023 00:57:00 Delivery type: Vaginal, Spontaneous       Feeding planned: breast feeding  ---------- Chari Manning, CNM Certified Nurse Midwife [facility]

## 2022-11-26 ENCOUNTER — Telehealth: Payer: Self-pay

## 2022-11-26 NOTE — Telephone Encounter (Signed)
Pt calling; is in first 6wks of preg; wants to talk to someone about sxs she is having.  [phone]  Pt states two weeks ago she was awakened 3 nights in a row by bad cramping and it happened again last night; in the meantime cramping is off and on; is pretty good during the day; has no spotting.  Adv pt she can take 2 e.x. tylenol every 6hrs while awake and apply heat 20 minutes out of every hour.  Pt is going to be seen elsewhere for this preg; they could not advise her b/c they haven't seen her yet.  Adv to cramping is stopping her in her tracks and doubling her over to call them for a 'cramping in early preg' appt.  Pt voices understanding.

## 2022-11-28 DIAGNOSIS — O26891 Other specified pregnancy related conditions, first trimester: Secondary | ICD-10-CM | POA: Diagnosis not present

## 2022-11-28 DIAGNOSIS — R109 Unspecified abdominal pain: Secondary | ICD-10-CM | POA: Diagnosis not present

## 2022-12-10 DIAGNOSIS — N912 Amenorrhea, unspecified: Secondary | ICD-10-CM | POA: Diagnosis not present

## 2022-12-11 DIAGNOSIS — O0991 Supervision of high risk pregnancy, unspecified, first trimester: Secondary | ICD-10-CM | POA: Insufficient documentation

## 2022-12-11 DIAGNOSIS — O0993 Supervision of high risk pregnancy, unspecified, third trimester: Secondary | ICD-10-CM | POA: Insufficient documentation

## 2023-01-08 DIAGNOSIS — O0991 Supervision of high risk pregnancy, unspecified, first trimester: Secondary | ICD-10-CM | POA: Diagnosis not present

## 2023-01-08 DIAGNOSIS — O3401 Maternal care for unspecified congenital malformation of uterus, first trimester: Secondary | ICD-10-CM | POA: Diagnosis not present

## 2023-01-08 DIAGNOSIS — Z113 Encounter for screening for infections with a predominantly sexual mode of transmission: Secondary | ICD-10-CM | POA: Diagnosis not present

## 2023-01-08 DIAGNOSIS — Z8659 Personal history of other mental and behavioral disorders: Secondary | ICD-10-CM | POA: Diagnosis not present

## 2023-01-08 DIAGNOSIS — Q513 Bicornate uterus: Secondary | ICD-10-CM | POA: Diagnosis not present

## 2023-01-08 DIAGNOSIS — Z1329 Encounter for screening for other suspected endocrine disorder: Secondary | ICD-10-CM | POA: Diagnosis not present

## 2023-01-08 DIAGNOSIS — Z131 Encounter for screening for diabetes mellitus: Secondary | ICD-10-CM | POA: Diagnosis not present

## 2023-01-08 DIAGNOSIS — Z114 Encounter for screening for human immunodeficiency virus [HIV]: Secondary | ICD-10-CM | POA: Diagnosis not present

## 2023-01-08 DIAGNOSIS — O09521 Supervision of elderly multigravida, first trimester: Secondary | ICD-10-CM | POA: Diagnosis not present

## 2023-01-08 LAB — OB RESULTS CONSOLE HEPATITIS B SURFACE ANTIGEN: Hepatitis B Surface Ag: NEGATIVE

## 2023-01-08 LAB — OB RESULTS CONSOLE VARICELLA ZOSTER ANTIBODY, IGG: Varicella: IMMUNE

## 2023-01-08 LAB — OB RESULTS CONSOLE RUBELLA ANTIBODY, IGM: Rubella: IMMUNE

## 2023-01-30 NOTE — Telephone Encounter (Signed)
Error

## 2023-03-06 DIAGNOSIS — O0992 Supervision of high risk pregnancy, unspecified, second trimester: Secondary | ICD-10-CM | POA: Diagnosis not present

## 2023-04-11 DIAGNOSIS — Z0373 Encounter for suspected fetal anomaly ruled out: Secondary | ICD-10-CM | POA: Diagnosis not present

## 2023-05-02 DIAGNOSIS — Z23 Encounter for immunization: Secondary | ICD-10-CM | POA: Diagnosis not present

## 2023-05-02 DIAGNOSIS — O09512 Supervision of elderly primigravida, second trimester: Secondary | ICD-10-CM | POA: Diagnosis not present

## 2023-05-07 DIAGNOSIS — L821 Other seborrheic keratosis: Secondary | ICD-10-CM | POA: Diagnosis not present

## 2023-05-07 DIAGNOSIS — B078 Other viral warts: Secondary | ICD-10-CM | POA: Diagnosis not present

## 2023-05-07 DIAGNOSIS — D225 Melanocytic nevi of trunk: Secondary | ICD-10-CM | POA: Diagnosis not present

## 2023-05-20 DIAGNOSIS — Q513 Bicornate uterus: Secondary | ICD-10-CM | POA: Diagnosis not present

## 2023-05-20 DIAGNOSIS — O3403 Maternal care for unspecified congenital malformation of uterus, third trimester: Secondary | ICD-10-CM | POA: Diagnosis not present

## 2023-06-03 NOTE — Patient Instructions (Signed)
Be Involved in Caring For Your Health:  Taking Medications When medications are taken as directed, they can greatly improve your health. But if they are not taken as prescribed, they may not work. In some cases, not taking them correctly can be harmful. To help ensure your treatment remains effective and safe, understand your medications and how to take them. Bring your medications to each visit for review by your provider.  Your lab results, notes, and after visit summary will be available on My Chart. We strongly encourage you to use this feature. If lab results are abnormal the clinic will contact you with the appropriate steps. If the clinic does not contact you assume the results are satisfactory. You can always view your results on My Chart. If you have questions regarding your health or results, please contact the clinic during office hours. You can also ask questions on My Chart.  We at [facility] are grateful that you chose [place] to provide your care. We strive to provide evidence-based and compassionate care and are always looking for feedback. If you get a survey from the clinic please complete this so we can hear your opinions.  Healthy Eating, Adult Healthy eating may help you get and keep a healthy body weight, reduce the risk of chronic disease, and live a long and productive life. It is important to follow a healthy eating pattern. Your nutritional and calorie needs should be met mainly by different nutrient-rich foods. What are tips for following this plan? Reading food labels Read labels and choose the following: Reduced or low sodium products. Juices with 100% fruit juice. Foods with low saturated fats (<3 g per serving) and high polyunsaturated and monounsaturated fats. Foods with whole grains, such as whole wheat, cracked wheat, brown rice, and wild rice. Whole grains that are fortified with folic acid. This is recommended for females who are pregnant or who want to  become pregnant. Read labels and do not eat or drink the following: Foods or drinks with added sugars. These include foods that contain brown sugar, corn sweetener, corn syrup, dextrose, fructose, glucose, high-fructose corn syrup, honey, invert sugar, lactose, malt syrup, maltose, molasses, raw sugar, sucrose, trehalose, or turbinado sugar. Limit your intake of added sugars to less than 10% of your total daily calories. Do not eat more than the following amounts of added sugar per day: 6 teaspoons (25 g) for females. 9 teaspoons (38 g) for males. Foods that contain processed or refined starches and grains. Refined grain products, such as white flour, degermed cornmeal, white bread, and white rice. Shopping Choose nutrient-rich snacks, such as vegetables, whole fruits, and nuts. Avoid high-calorie and high-sugar snacks, such as potato chips, fruit snacks, and candy. Use oil-based dressings and spreads on foods instead of solid fats such as butter, margarine, sour cream, or cream cheese. Limit pre-made sauces, mixes, and "instant" products such as flavored rice, instant noodles, and ready-made pasta. Try more plant-protein sources, such as tofu, tempeh, black beans, edamame, lentils, nuts, and seeds. Explore eating plans such as the Mediterranean diet or vegetarian diet. Try heart-healthy dips made with beans and healthy fats like hummus and guacamole. Vegetables go great with these. Cooking Use oil to saut or stir-fry foods instead of solid fats such as butter, margarine, or lard. Try baking, boiling, grilling, or broiling instead of frying. Remove the fatty part of meats before cooking. Steam vegetables in water or broth. Meal planning  At meals, imagine dividing your plate into fourths: One-half of  your plate is fruits and vegetables. One-fourth of your plate is whole grains. One-fourth of your plate is protein, especially lean meats, poultry, eggs, tofu, beans, or nuts. Include low-fat  dairy as part of your daily diet. Lifestyle Choose healthy options in all settings, including home, work, school, restaurants, or stores. Prepare your food safely: Wash your hands after handling raw meats. Where you prepare food, keep surfaces clean by regularly washing with hot, soapy water. Keep raw meats separate from ready-to-eat foods, such as fruits and vegetables. Cook seafood, meat, poultry, and eggs to the recommended temperature. Get a food thermometer. Store foods at safe temperatures. In general: Keep cold foods at 48F (4.4C) or below. Keep hot foods at 148F (60C) or above. Keep your freezer at [facility] (-17.8C) or below. Foods are not safe to eat if they have been between the temperatures of 40-148F (4.4-60C) for more than 2 hours. What foods should I eat? Fruits Aim to eat 1-2 cups of fresh, canned (in natural juice), or frozen fruits each day. One cup of fruit equals 1 small apple, 1 large banana, 8 large strawberries, 1 cup (237 g) canned fruit,  cup (82 g) dried fruit, or 1 cup (240 mL) 100% juice. Vegetables Aim to eat 2-4 cups of fresh and frozen vegetables each day, including different varieties and colors. One cup of vegetables equals 1 cup (91 g) broccoli or cauliflower florets, 2 medium carrots, 2 cups (150 g) raw, leafy greens, 1 large tomato, 1 large bell pepper, 1 large sweet potato, or 1 medium white potato. Grains Aim to eat 5-10 ounce-equivalents of whole grains each day. Examples of 1 ounce-equivalent of grains include 1 slice of bread, 1 cup (40 g) ready-to-eat cereal, 3 cups (24 g) popcorn, or  cup (93 g) cooked rice. Meats and other proteins Try to eat 5-7 ounce-equivalents of protein each day. Examples of 1 ounce-equivalent of protein include 1 egg,  oz nuts (12 almonds, 24 pistachios, or 7 walnut halves), 1/4 cup (90 g) cooked beans, 6 tablespoons (90 g) hummus or 1 tablespoon (16 g) peanut butter. A cut of meat or fish that is the size of a deck of  cards is about 3-4 ounce-equivalents (85 g). Of the protein you eat each week, try to have at least 8 sounce (227 g) of seafood. This is about 2 servings per week. This includes salmon, trout, herring, sardines, and anchovies. Dairy Aim to eat 3 cup-equivalents of fat-free or low-fat dairy each day. Examples of 1 cup-equivalent of dairy include 1 cup (240 mL) milk, 8 ounces (250 g) yogurt, 1 ounces (44 g) natural cheese, or 1 cup (240 mL) fortified soy milk. Fats and oils Aim for about 5 teaspoons (21 g) of fats and oils per day. Choose monounsaturated fats, such as canola and olive oils, mayonnaise made with olive oil or avocado oil, avocados, peanut butter, and most nuts, or polyunsaturated fats, such as sunflower, corn, and soybean oils, walnuts, pine nuts, sesame seeds, sunflower seeds, and flaxseed. Beverages Aim for 6 eight-ounce glasses of water per day. Limit coffee to 3-5 eight-ounce cups per day. Limit caffeinated beverages that have added calories, such as soda and energy drinks. If you drink alcohol: Limit how much you have to: 0-1 drink a day if you are female. 0-2 drinks a day if you are female. Know how much alcohol is in your drink. In the [place] one drink is one 12 oz bottle of beer (355 mL), one 5 oz glass of wine (  148 mL), or one 1 oz glass of hard liquor (44 mL). Seasoning and other foods Try not to add too much salt to your food. Try using herbs and spices instead of salt. Try not to add sugar to food. This information is based on [place] nutrition guidelines. To learn more, visit DisposableNylon.be. Exact amounts may vary. You may need different amounts. This information is not intended to replace advice given to you by your health care provider. Make sure you discuss any questions you have with your health care provider. Document Revised: 06/18/2022 Document Reviewed: 06/18/2022 Elsevier Patient Education  2024 [employer].

## 2023-06-05 DIAGNOSIS — Z3A33 33 weeks gestation of pregnancy: Secondary | ICD-10-CM | POA: Diagnosis not present

## 2023-06-05 DIAGNOSIS — Z2911 Encounter for prophylactic immunotherapy for respiratory syncytial virus (RSV): Secondary | ICD-10-CM | POA: Diagnosis not present

## 2023-06-07 ENCOUNTER — Ambulatory Visit (INDEPENDENT_AMBULATORY_CARE_PROVIDER_SITE_OTHER): Payer: BC Managed Care – PPO | Admitting: Nurse Practitioner

## 2023-06-07 ENCOUNTER — Encounter: Payer: Self-pay | Admitting: Nurse Practitioner

## 2023-06-07 VITALS — BP 138/80 | HR 94 | Temp 98.4°F | Ht 65.9 in | Wt 206.0 lb

## 2023-06-07 DIAGNOSIS — Z136 Encounter for screening for cardiovascular disorders: Secondary | ICD-10-CM

## 2023-06-07 DIAGNOSIS — Z13220 Encounter for screening for lipoid disorders: Secondary | ICD-10-CM

## 2023-06-07 DIAGNOSIS — Z8659 Personal history of other mental and behavioral disorders: Secondary | ICD-10-CM | POA: Diagnosis not present

## 2023-06-07 DIAGNOSIS — Z0000 Encounter for general adult medical examination without abnormal findings: Secondary | ICD-10-CM

## 2023-06-07 NOTE — Assessment & Plan Note (Signed)
Monitor mood and restart medication as needed, previously took Zoloft.

## 2023-06-07 NOTE — Progress Notes (Addendum)
BP 138/80 (BP Location: Left Arm, Patient Position: Sitting, Cuff Size: Normal)   Pulse 94   Temp 98.4 F (36.9 C) (Oral)   Ht 5' 5.9" (1.674 m)   Wt 206 lb (93.4 kg)   LMP 04/12/2022 (Exact Date)   SpO2 98%   BMI 33.35 kg/m    Subjective:    Patient ID: Kayla Townsend, female    DOB: 1986/07/22, 37 y.o.   MRN: [id number]  HPI: Kayla Townsend is a 37 y.o. female presenting on 06/07/2023 for comprehensive medical examination. Current medical complaints include:none  She currently lives with: significant other Menopausal Symptoms: no  Currently [redacted] weeks pregnant and followed by [facility].     06/07/2023    9:40 AM 06/05/2022    8:50 AM 06/01/2021   10:54 AM 05/19/2020   10:29 AM  Depression screen PHQ 2/9  Decreased Interest 0 0 0 0  Down, Depressed, Hopeless 1 0 0 0  PHQ - 2 Score 1 0 0 0  Altered sleeping 1 0    Tired, decreased energy 1 0    Change in appetite 0 0    Feeling bad or failure about yourself  0 0    Trouble concentrating 0 0    Moving slowly or fidgety/restless 0 0    Suicidal thoughts 0 0    PHQ-9 Score 3 0    Difficult doing work/chores Not difficult at all Not difficult at all         06/07/2023    9:41 AM 06/05/2022    8:51 AM 04/27/2021    9:33 AM  GAD 7 : Generalized Anxiety Score  Nervous, Anxious, on Edge 0 0 0  Control/stop worrying 0 0 0  Worry too much - different things 0 0 0  Trouble relaxing 0 0 0  Restless 0 0 0  Easily annoyed or irritable 0 0 0  Afraid - awful might happen 0 0 0  Total GAD 7 Score 0 0 0  Anxiety Difficulty Not difficult at all Not difficult at all Not difficult at all      01/06/2019    2:30 PM 06/05/2022    8:50 AM 06/05/2022    9:10 AM 06/13/2022    8:16 PM 06/07/2023    9:40 AM  Fall Risk  Falls in the past year?  0 0  0  Was there an injury with Fall?  0 0  0  Fall Risk Category Calculator  0 0  0  Fall Risk Category (Retired)  Low Low    (RETIRED) Patient Fall Risk Level Low fall risk Low  fall risk Low fall risk Low fall risk   Patient at Risk for Falls Due to  No Fall Risks No Fall Risks  No Fall Risks  Fall risk Follow up  Falls evaluation completed Education provided  Falls evaluation completed    Functional Status Survey: Is the patient deaf or have difficulty hearing?: No Does the patient have difficulty seeing, even when wearing glasses/contacts?: No Does the patient have difficulty concentrating, remembering, or making decisions?: No Does the patient have difficulty walking or climbing stairs?: No Does the patient have difficulty dressing or bathing?: No Does the patient have difficulty doing errands alone such as visiting a doctor's office or shopping?: No    Past Medical History:  Past Medical History:  Diagnosis Date   Abnormal Pap smear of vagina    Allergy    Anxiety    Cervical high  risk human papillomavirus (HPV) DNA test positive 03/2018   Cholelithiases 03/2018   on renal u/s, no sx   Kidney stone    Miscarriage 06/2022    Surgical History:  Past Surgical History:  Procedure Laterality Date   WISDOM TOOTH EXTRACTION      Medications:  Current Outpatient Medications on File Prior to Visit  Medication Sig   aspirin EC 81 MG tablet Take 81 mg by mouth daily. Swallow whole.   ondansetron (ZOFRAN) 4 MG tablet Take 4 mg by mouth every 12 (twelve) hours as needed for nausea.   Prenatal Vit-Fe Fumarate-FA (PRENATAL MULTIVITAMIN) TABS tablet Take 1 tablet by mouth daily at 12 noon.   No current facility-administered medications on file prior to visit.    Allergies:  Allergies  Allergen Reactions   Ceclor [Cefaclor] Swelling    As a child    Social History:  Social History   Socioeconomic History   Marital status: Married    Spouse name: Ben   Number of children: 0   Years of education: 18   Highest education level: Not on file  Occupational History   Occupation: nonprofit - [employer]  Tobacco Use   Smoking status: Never    Smokeless tobacco: Never  Vaping Use   Vaping status: Never Used  Substance and Sexual Activity   Alcohol use: Not Currently    Comment: occasional   Drug use: No   Sexual activity: Yes    Partners: Male    Birth control/protection: None    Comment: Currently pregnant  Other Topics Concern   Not on file  Social History Narrative   Not on file   Social Determinants of Health   Financial Resource Strain: Low Risk  (06/13/2022)   Overall Financial Resource Strain (CARDIA)    Difficulty of Paying Living Expenses: Not hard at all  Food Insecurity: No Food Insecurity (06/13/2022)   Hunger Vital Sign    Worried About Running Out of Food in the Last Year: Never true    Ran Out of Food in the Last Year: Never true  Transportation Needs: No Transportation Needs (06/13/2022)   PRAPARE - Administrator, Civil Service (Medical): No    Lack of Transportation (Non-Medical): No  Physical Activity: Sufficiently Active (06/13/2022)   Exercise Vital Sign    Days of Exercise per Week: 3 days    Minutes of Exercise per Session: 50 min  Stress: No Stress Concern Present (06/13/2022)   [employer] of Occupational Health - Occupational Stress Questionnaire    Feeling of Stress : Not at all  Social Connections: Socially Integrated (06/13/2022)   Social Connection and Isolation Panel [NHANES]    Frequency of Communication with Friends and Family: More than three times a week    Frequency of Social Gatherings with Friends and Family: Once a week    Attends Religious Services: More than 4 times per year    Active Member of [employer] or Organizations: Yes    Attends Banker Meetings: More than 4 times per year    Marital Status: Married  Catering manager Violence: Not At Risk (06/13/2022)   Humiliation, Afraid, Rape, and Kick questionnaire    Fear of Current or Ex-Partner: No    Emotionally Abused: No    Physically Abused: No    Sexually Abused: No   Social History    Tobacco Use  Smoking Status Never  Smokeless Tobacco Never   Social History   Substance  and Sexual Activity  Alcohol Use Not Currently   Comment: occasional    Family History:  Family History  Problem Relation Age of Onset   Hyperlipidemia Mother    Anxiety disorder Mother    Stroke Father 33   Dementia Father    Diabetes Father    Bipolar disorder Father    Anxiety disorder Brother    Atrial fibrillation Maternal Grandmother    Diabetes Maternal Grandfather    Alzheimer's disease Maternal Grandfather    Alzheimer's disease Paternal Grandmother    Lung cancer Paternal Grandfather 56   Ovarian cancer Neg Hx     Past medical history, surgical history, medications, allergies, family history and social history reviewed with patient today and changes made to appropriate areas of the chart.   ROS All other ROS negative except what is listed above and in the HPI.      Objective:    BP 138/80 (BP Location: Left Arm, Patient Position: Sitting, Cuff Size: Normal)   Pulse 94   Temp 98.4 F (36.9 C) (Oral)   Ht 5' 5.9" (1.674 m)   Wt 206 lb (93.4 kg)   LMP 04/12/2022 (Exact Date)   SpO2 98%   BMI 33.35 kg/m   Wt Readings from Last 3 Encounters:  06/07/23 206 lb (93.4 kg)  06/26/22 165 lb (74.8 kg)  06/14/22 161 lb (73 kg)    Physical Exam Vitals and nursing note reviewed. Exam conducted with a chaperone present.  Constitutional:      General: She is awake. She is not in acute distress.    Appearance: She is well-developed. She is not ill-appearing.  HENT:     Head: Normocephalic and atraumatic.     Right Ear: Hearing, tympanic membrane, ear canal and external ear normal. No drainage.     Left Ear: Hearing, tympanic membrane, ear canal and external ear normal. No drainage.     Nose: Nose normal.     Right Sinus: No maxillary sinus tenderness or frontal sinus tenderness.     Left Sinus: No maxillary sinus tenderness or frontal sinus tenderness.     Mouth/Throat:      Mouth: Mucous membranes are moist.     Pharynx: Oropharynx is clear. Uvula midline. No pharyngeal swelling, oropharyngeal exudate or posterior oropharyngeal erythema.  Eyes:     General: Lids are normal.        Right eye: No discharge.        Left eye: No discharge.     Extraocular Movements: Extraocular movements intact.     Conjunctiva/sclera: Conjunctivae normal.     Pupils: Pupils are equal, round, and reactive to light.     Visual Fields: Right eye visual fields normal and left eye visual fields normal.  Neck:     Thyroid: No thyromegaly.     Vascular: No carotid bruit.     Trachea: Trachea normal.  Cardiovascular:     Rate and Rhythm: Normal rate and regular rhythm.     Heart sounds: Normal heart sounds. No murmur heard.    No gallop.  Pulmonary:     Effort: Pulmonary effort is normal. No accessory muscle usage or respiratory distress.     Breath sounds: Normal breath sounds.  Abdominal:     General: Bowel sounds are normal.     Palpations: Abdomen is soft. There is no hepatomegaly or splenomegaly.     Tenderness: There is no abdominal tenderness.  Musculoskeletal:        General: Normal  range of motion.     Cervical back: Normal range of motion and neck supple.     Right lower leg: No edema.     Left lower leg: No edema.  Lymphadenopathy:     Head:     Right side of head: No submental, submandibular, tonsillar, preauricular or posterior auricular adenopathy.     Left side of head: No submental, submandibular, tonsillar, preauricular or posterior auricular adenopathy.     Cervical: No cervical adenopathy.  Skin:    General: Skin is warm and dry.     Capillary Refill: Capillary refill takes less than 2 seconds.     Findings: No rash.  Neurological:     Mental Status: She is alert and oriented to person, place, and time.     Gait: Gait is intact.     Deep Tendon Reflexes: Reflexes are normal and symmetric.     Reflex Scores:      Brachioradialis reflexes are 2+ on  the right side and 2+ on the left side.      Patellar reflexes are 2+ on the right side and 2+ on the left side. Psychiatric:        Attention and Perception: Attention normal.        Mood and Affect: Mood normal.        Speech: Speech normal.        Behavior: Behavior normal. Behavior is cooperative.        Thought Content: Thought content normal.        Judgment: Judgment normal.    Results for orders placed or performed in visit on 06/26/22  Beta HCG, Quant  Result Value Ref Range   hCG Quant 15 mIU/mL  Rubella screen  Result Value Ref Range   Rubella Antibodies, IGG <0.90 (L) Immune >0.99 index  Varicella zoster antibody, IgG  Result Value Ref Range   Varicella zoster IgG 696 Immune >165 index      Assessment & Plan:   Problem List Items Addressed This Visit       Other   History of anxiety disorder - Primary    Monitor mood and restart medication as needed, previously took Zoloft.      Relevant Orders   CBC with Differential/Platelet   TSH   Other Visit Diagnoses     Encounter for lipid screening for cardiovascular disease       Lipid panel in office today.   Relevant Orders   Comprehensive metabolic panel   Lipid Panel w/o Chol/HDL Ratio   Encounter for annual physical exam       Annual physical today with labs and health maintenance reviewed, discussed with patient.        Follow up plan: Return in about 1 year (around 06/06/2024) for Annual Physical.   LABORATORY TESTING:  - Pap smear: up to date  IMMUNIZATIONS:   - Tdap: Tetanus vaccination status reviewed: last tetanus booster within 10 years. - Influenza: Refused - Pneumovax: Not applicable - Prevnar: Not applicable - COVID: Up to date - HPV: Not applicable - Shingrix vaccine: Not applicable  SCREENING: -Mammogram: Not applicable  - Colonoscopy: Not applicable  - Bone Density: Not applicable  -Hearing Test: Not applicable  -Spirometry: Not applicable   PATIENT COUNSELING:   Advised to  take 1 mg of folate supplement per day if capable of pregnancy.   Sexuality: Discussed sexually transmitted diseases, partner selection, use of condoms, avoidance of unintended pregnancy  and contraceptive alternatives.   Advised  to avoid cigarette smoking.  I discussed with the patient that most people either abstain from alcohol or drink within safe limits (<=14/week and <=4 drinks/occasion for males, <=7/weeks and <= 3 drinks/occasion for females) and that the risk for alcohol disorders and other health effects rises proportionally with the number of drinks per week and how often a drinker exceeds daily limits.  Discussed cessation/primary prevention of drug use and availability of treatment for abuse.   Diet: Encouraged to adjust caloric intake to maintain  or achieve ideal body weight, to reduce intake of dietary saturated fat and total fat, to limit sodium intake by avoiding high sodium foods and not adding table salt, and to maintain adequate dietary potassium and calcium preferably from fresh fruits, vegetables, and low-fat dairy products.    Stressed the importance of regular exercise  Injury prevention: Discussed safety belts, safety helmets, smoke detector, smoking near bedding or upholstery.   Dental health: Discussed importance of regular tooth brushing, flossing, and dental visits.    NEXT PREVENTATIVE PHYSICAL DUE IN 1 YEAR. Return in about 1 year (around 06/06/2024) for Annual Physical.

## 2023-06-08 ENCOUNTER — Other Ambulatory Visit: Payer: Self-pay | Admitting: Nurse Practitioner

## 2023-06-08 DIAGNOSIS — R739 Hyperglycemia, unspecified: Secondary | ICD-10-CM

## 2023-06-08 DIAGNOSIS — E876 Hypokalemia: Secondary | ICD-10-CM

## 2023-06-08 LAB — COMPREHENSIVE METABOLIC PANEL
ALT: 27 IU/L (ref 0–32)
AST: 30 IU/L (ref 0–40)
Albumin: 3.3 g/dL — ABNORMAL LOW (ref 3.9–4.9)
Alkaline Phosphatase: 113 IU/L (ref 44–121)
BUN/Creatinine Ratio: 9 (ref 9–23)
BUN: 5 mg/dL — ABNORMAL LOW (ref 6–20)
Bilirubin Total: 0.2 mg/dL (ref 0.0–1.2)
CO2: 20 mmol/L (ref 20–29)
Calcium: 8.5 mg/dL — ABNORMAL LOW (ref 8.7–10.2)
Chloride: 106 mmol/L (ref 96–106)
Creatinine, Ser: 0.58 mg/dL (ref 0.57–1.00)
Globulin, Total: 2.2 g/dL (ref 1.5–4.5)
Glucose: 133 mg/dL — ABNORMAL HIGH (ref 70–99)
Potassium: 2.8 mmol/L — ABNORMAL LOW (ref 3.5–5.2)
Sodium: 140 mmol/L (ref 134–144)
Total Protein: 5.5 g/dL — ABNORMAL LOW (ref 6.0–8.5)
eGFR: 121 mL/min/{1.73_m2} (ref >59)

## 2023-06-08 LAB — LIPID PANEL W/O CHOL/HDL RATIO
Cholesterol, Total: 229 mg/dL — ABNORMAL HIGH (ref 100–199)
HDL: 88 mg/dL (ref >39)
LDL Chol Calc (NIH): 112 mg/dL — ABNORMAL HIGH (ref 0–99)
Triglycerides: 173 mg/dL — ABNORMAL HIGH (ref 0–149)
VLDL Cholesterol Cal: 29 mg/dL (ref 5–40)

## 2023-06-08 LAB — CBC WITH DIFFERENTIAL/PLATELET
Basophils Absolute: 0 10*3/uL (ref 0.0–0.2)
Basos: 0 %
EOS (ABSOLUTE): 0.1 10*3/uL (ref 0.0–0.4)
Eos: 1 %
Hematocrit: 32.3 % — ABNORMAL LOW (ref 34.0–46.6)
Hemoglobin: 11.4 g/dL (ref 11.1–15.9)
Immature Grans (Abs): 0.1 10*3/uL (ref 0.0–0.1)
Immature Granulocytes: 1 %
Lymphocytes Absolute: 1.2 10*3/uL (ref 0.7–3.1)
Lymphs: 15 %
MCH: 31.8 pg (ref 26.6–33.0)
MCHC: 35.3 g/dL (ref 31.5–35.7)
MCV: 90 fL (ref 79–97)
Monocytes Absolute: 0.5 10*3/uL (ref 0.1–0.9)
Monocytes: 7 %
Neutrophils Absolute: 5.9 10*3/uL (ref 1.4–7.0)
Neutrophils: 76 %
Platelets: 158 10*3/uL (ref 150–450)
RBC: 3.58 x10E6/uL — ABNORMAL LOW (ref 3.77–5.28)
RDW: 13 % (ref 11.7–15.4)
WBC: 7.9 10*3/uL (ref 3.4–10.8)

## 2023-06-08 LAB — TSH: TSH: 1.04 u[IU]/mL (ref 0.450–4.500)

## 2023-06-08 MED ORDER — POTASSIUM CHLORIDE CRYS ER 10 MEQ PO TBCR: 10.0000 meq | EXTENDED_RELEASE_TABLET | Freq: Every day | ORAL | 0 refills | Status: DC

## 2023-06-08 NOTE — Progress Notes (Signed)
Good morning, wanted to send an FYI on this patient since both of you recently saw this mutual patient.  She came for physical yesterday.  Her potassium is 2.8 and I am going to do a 5 day replacement, then recheck.  Glucose was elevated too, but suspect she just ate.  However, will check A1c on her repeat labs next week.  Her BP is slightly elevated from baseline for her and she does have edema to BLE + reports feeling some swelling to hands on occasion, I did alert her to monitor this closely and report any worsening in symptoms immediately to you all:)  Have a great day!!

## 2023-06-08 NOTE — Progress Notes (Signed)
Contacted via MyChart -- needs lab only visit end of this week please   Good morning [name], your labs have returned: - CBC shows mild low hematocrit, but normal hemoglobin, make sure you are eating a diet with iron rich foods. - Glucose is elevated, if you ate before visit this would explain higher level.  Did you eat?  Main concern is potassium is quite low.  I am going to order potassium supplement for you to take for 5 days and will have staff call to schedule a lab only visit for Friday to recheck levels.  Add some potassium rich foods to diet too. - Thyroid level normal. - Cholesterol levels showing elevation this check, ensure healthy diet at home and regular exercise.  Any questions? Keep being amazing!!  Thank you for allowing me to participate in your care.  I appreciate you. Kindest regards, Nance Mccombs

## 2023-06-11 NOTE — Progress Notes (Signed)
Attempted to reach patient to schedule her one week lab appointment on Friday, LVM to call office back to schedule.  Put in CRM.

## 2023-06-16 ENCOUNTER — Encounter: Payer: Self-pay | Admitting: Nurse Practitioner

## 2023-06-20 ENCOUNTER — Other Ambulatory Visit: Payer: Self-pay

## 2023-06-20 ENCOUNTER — Observation Stay
Admission: EM | Admit: 2023-06-20 | Discharge: 2023-06-20 | Disposition: A | Discharge status: Home / Self Care | Payer: BC Managed Care – PPO | Attending: Certified Nurse Midwife | Admitting: Certified Nurse Midwife

## 2023-06-20 DIAGNOSIS — Z3A35 35 weeks gestation of pregnancy: Secondary | ICD-10-CM | POA: Diagnosis not present

## 2023-06-20 DIAGNOSIS — Z3490 Encounter for supervision of normal pregnancy, unspecified, unspecified trimester: Principal | ICD-10-CM

## 2023-06-20 DIAGNOSIS — O163 Unspecified maternal hypertension, third trimester: Secondary | ICD-10-CM | POA: Diagnosis present

## 2023-06-20 DIAGNOSIS — O133 Gestational [pregnancy-induced] hypertension without significant proteinuria, third trimester: Secondary | ICD-10-CM | POA: Diagnosis not present

## 2023-06-20 DIAGNOSIS — Z79899 Other long term (current) drug therapy: Secondary | ICD-10-CM | POA: Insufficient documentation

## 2023-06-20 DIAGNOSIS — Z3A32 32 weeks gestation of pregnancy: Secondary | ICD-10-CM | POA: Diagnosis not present

## 2023-06-20 DIAGNOSIS — O0993 Supervision of high risk pregnancy, unspecified, third trimester: Secondary | ICD-10-CM | POA: Diagnosis not present

## 2023-06-20 DIAGNOSIS — O3403 Maternal care for unspecified congenital malformation of uterus, third trimester: Secondary | ICD-10-CM | POA: Diagnosis not present

## 2023-06-20 DIAGNOSIS — Z7982 Long term (current) use of aspirin: Secondary | ICD-10-CM | POA: Diagnosis not present

## 2023-06-20 DIAGNOSIS — O99343 Other mental disorders complicating pregnancy, third trimester: Secondary | ICD-10-CM | POA: Insufficient documentation

## 2023-06-20 DIAGNOSIS — Q513 Bicornate uterus: Secondary | ICD-10-CM | POA: Insufficient documentation

## 2023-06-20 DIAGNOSIS — O09523 Supervision of elderly multigravida, third trimester: Secondary | ICD-10-CM | POA: Diagnosis not present

## 2023-06-20 DIAGNOSIS — F419 Anxiety disorder, unspecified: Secondary | ICD-10-CM | POA: Insufficient documentation

## 2023-06-20 LAB — CBC
HCT: 30.9 % — ABNORMAL LOW (ref 36.0–46.0)
Hemoglobin: 11.3 g/dL — ABNORMAL LOW (ref 12.0–15.0)
MCH: 32.3 pg (ref 26.0–34.0)
MCHC: 36.6 g/dL — ABNORMAL HIGH (ref 30.0–36.0)
MCV: 88.3 fL (ref 80.0–100.0)
Platelets: 162 10*3/uL (ref 150–400)
RBC: 3.5 MIL/uL — ABNORMAL LOW (ref 3.87–5.11)
RDW: 14 % (ref 11.5–15.5)
WBC: 9.7 10*3/uL (ref 4.0–10.5)
nRBC: 0 % (ref 0.0–0.2)

## 2023-06-20 LAB — COMPREHENSIVE METABOLIC PANEL
ALT: 25 U/L (ref 0–44)
AST: 31 U/L (ref 15–41)
Albumin: 2.8 g/dL — ABNORMAL LOW (ref 3.5–5.0)
Alkaline Phosphatase: 112 U/L (ref 38–126)
Anion gap: 7 (ref 5–15)
BUN: 8 mg/dL (ref 6–20)
CO2: 24 mmol/L (ref 22–32)
Calcium: 9 mg/dL (ref 8.9–10.3)
Chloride: 107 mmol/L (ref 98–111)
Creatinine, Ser: 0.56 mg/dL (ref 0.44–1.00)
GFR, Estimated: >60 mL/min (ref >60)
Glucose, Bld: 85 mg/dL (ref 70–99)
Potassium: 2.9 mmol/L — ABNORMAL LOW (ref 3.5–5.1)
Sodium: 138 mmol/L (ref 135–145)
Total Bilirubin: 0.6 mg/dL (ref 0.3–1.2)
Total Protein: 6.1 g/dL — ABNORMAL LOW (ref 6.5–8.1)

## 2023-06-20 LAB — PROTEIN / CREATININE RATIO, URINE
Creatinine, Urine: 55 mg/dL
Protein Creatinine Ratio: 0.22 mg/mg{Cre} — ABNORMAL HIGH (ref 0.00–0.15)
Total Protein, Urine: 12 mg/dL

## 2023-06-20 MED ORDER — LABETALOL HCL 5 MG/ML IV SOLN: 40.0000 mg | INTRAVENOUS | Status: DC | PRN

## 2023-06-20 MED ORDER — LABETALOL HCL 5 MG/ML IV SOLN: 80.0000 mg | INTRAVENOUS | Status: DC | PRN

## 2023-06-20 MED ORDER — HYDRALAZINE HCL 20 MG/ML IJ SOLN: 10.0000 mg | INTRAMUSCULAR | Status: DC | PRN

## 2023-06-20 MED ORDER — LABETALOL HCL 5 MG/ML IV SOLN: 20.0000 mg | INTRAVENOUS | Status: DC | PRN

## 2023-06-20 NOTE — OB Triage Note (Signed)
Patient is a G1P0 at [redacted]w[redacted]d who was sent over from the office for a PIH eval. Reports +FM, denies LOF, vaginal bleeding, and ctx. Initial BP 149/88, cycling q . Denies HA, blurry vision, and epigastric pain. Reports swelling in feet and hands. External monitors applied and assessing.

## 2023-06-20 NOTE — Discharge Summary (Signed)
Kayla Townsend is a 37 y.o. female. She is at [redacted]w[redacted]d gestation.  Estimated Date of Delivery: 07/23/23  Prenatal care site: [facility]  Chief complaint: Elevated blood pressures   HPI: Kayla Townsend presents to L&D with complaints of elevated blood pressures during routine prenatal visits.   Factors complicating pregnancy: Bicornate uterus AMA Anxiety GHTN  S: Resting comfortably. no CTX, no VB.no LOF,  Active fetal movement.   Maternal Medical History:  Past Medical Hx:  has a past medical history of Abnormal Pap smear of vagina, Allergy, Anxiety, Cervical high risk human papillomavirus (HPV) DNA test positive (03/2018), Cholelithiases (03/2018), Kidney stone, and Miscarriage (06/2022).    Past Surgical Hx:  has a past surgical history that includes Wisdom tooth extraction.   Allergies  Allergen Reactions   Ceclor [Cefaclor] Swelling    As a child     Prior to Admission medications   Medication Sig Start Date End Date Taking? Authorizing Provider  aspirin EC 81 MG tablet Take 81 mg by mouth daily. Swallow whole.    [provider]  ondansetron (ZOFRAN) 4 MG tablet Take 4 mg by mouth every 12 (twelve) hours as needed for nausea. 01/08/23   [provider]  potassium chloride (KLOR-CON M) 10 MEQ tablet Take 1 tablet (10 mEq total) by mouth daily for 5 days. 06/08/23 06/13/23  Marjie Skiff, NP  Prenatal Vit-Fe Fumarate-FA (PRENATAL MULTIVITAMIN) TABS tablet Take 1 tablet by mouth daily at 12 noon.    [provider]    Social History: She  reports that she has never smoked. She has never used smokeless tobacco. She reports that she does not currently use alcohol. She reports that she does not use drugs.  Family History: family history includes Alzheimer's disease in her maternal grandfather and paternal grandmother; Anxiety disorder in her brother and mother; Atrial fibrillation in her maternal grandmother; Bipolar disorder in her father;  Dementia in her father; Diabetes in her father and maternal grandfather; Hyperlipidemia in her mother; Lung cancer (age of onset: 51) in her paternal grandfather; Stroke (age of onset: 51) in her father. ,no history of gyn cancers  Review of Systems: A full review of systems was performed and negative except as noted in the HPI.    O:  BP (!) 147/80   Pulse 85   Temp 98.2 F (36.8 C) (Oral)   Resp 15   Ht 5\' 6"  (1.676 m)   Wt 94.3 kg   BMI 33.57 kg/m  Results for orders placed or performed during the hospital encounter of 06/20/23 (from the past 48 hour(s))  Protein / creatinine ratio, urine   Collection Time: 06/20/23 11:05 AM  Result Value Ref Range   Creatinine, Urine 55 mg/dL   Total Protein, Urine 12 mg/dL   Protein Creatinine Ratio 0.22 (H) 0.00 - 0.15 mg/mg[Cre]  CBC   Collection Time: 06/20/23 11:05 AM  Result Value Ref Range   WBC 9.7 4.0 - 10.5 K/uL   RBC 3.50 (L) 3.87 - 5.11 MIL/uL   Hemoglobin 11.3 (L) 12.0 - 15.0 g/dL   HCT 47.8 (L) 29.5 - 62.1 %   MCV 88.3 80.0 - 100.0 fL   MCH 32.3 26.0 - 34.0 pg   MCHC 36.6 (H) 30.0 - 36.0 g/dL   RDW 30.8 65.7 - 84.6 %   Platelets 162 150 - 400 K/uL   nRBC 0.0 0.0 - 0.2 %  Comprehensive metabolic panel   Collection Time: 06/20/23 11:05 AM  Result Value  Ref Range   Sodium 138 135 - 145 mmol/L   Potassium 2.9 (L) 3.5 - 5.1 mmol/L   Chloride 107 98 - 111 mmol/L   CO2 24 22 - 32 mmol/L   Glucose, Bld 85 70 - 99 mg/dL   BUN 8 6 - 20 mg/dL   Creatinine, Ser 7.82 0.44 - 1.00 mg/dL   Calcium 9.0 8.9 - 95.6 mg/dL   Total Protein 6.1 (L) 6.5 - 8.1 g/dL   Albumin 2.8 (L) 3.5 - 5.0 g/dL   AST 31 15 - 41 U/L   ALT 25 0 - 44 U/L   Alkaline Phosphatase 112 38 - 126 U/L   Total Bilirubin 0.6 0.3 - 1.2 mg/dL   GFR, Estimated >21 >30 mL/min   Anion gap 7 5 - 15     Constitutional: NAD, AAOx3  HE/ENT: extraocular movements grossly intact, moist mucous membranes CV: RRR PULM: nl respiratory effort, CTABL Abd: gravid, non-tender,  non-distended, soft  Ext: Non-tender, Nonedmeatous Psych: mood appropriate, speech normal Pelvic : deferred SVE:  Deferred   NST: Baseline FHR: 145 beats/min Variability: moderate Accelerations: present Decelerations: absent Tocometry: None  Interpretation:  INDICATIONS: GHTN RESULTS:  A NST procedure was performed with FHR monitoring and a normal baseline established, appropriate time of 20-40 minutes of evaluation, and accels >2 seen w 15x15 characteristics.  Results show a REACTIVE NST.    Kayla Townsend, CNM   Category: I   Assessment: 37 y.o. [redacted]w[redacted]d here for antenatal surveillance during pregnancy.  Principle diagnosis: Elevated blood pressure in third trimester    Plan: Labor: not present.  Fetal Wellbeing: Reassuring Cat 1 tracing. Reactive NST  PIH labs WNL Start weekly NSTs  BP check on Monday Plan for IOL at 37 weeks  D/c home stable, precautions reviewed, follow-up as scheduled.    ----- Roney Jaffe, CNM Certified Nurse Midwife [facility]

## 2023-06-20 NOTE — OB Triage Note (Signed)
Discharge instructions, labor precautions, and follow-up care reviewed with patient and significant other. All questions answered. Patient verbalized understanding. Discharged ambulatory off unit.

## 2023-06-24 DIAGNOSIS — O133 Gestational [pregnancy-induced] hypertension without significant proteinuria, third trimester: Secondary | ICD-10-CM | POA: Diagnosis not present

## 2023-06-24 DIAGNOSIS — O0993 Supervision of high risk pregnancy, unspecified, third trimester: Secondary | ICD-10-CM | POA: Diagnosis not present

## 2023-06-24 LAB — OB RESULTS CONSOLE GC/CHLAMYDIA
Chlamydia: NEGATIVE
Neisseria Gonorrhea: NEGATIVE

## 2023-06-24 LAB — OB RESULTS CONSOLE HIV ANTIBODY (ROUTINE TESTING): HIV: NONREACTIVE

## 2023-06-24 LAB — OB RESULTS CONSOLE GBS: GBS: NEGATIVE

## 2023-07-02 ENCOUNTER — Other Ambulatory Visit: Payer: Self-pay

## 2023-07-02 ENCOUNTER — Encounter: Payer: Self-pay | Admitting: Obstetrics and Gynecology

## 2023-07-02 ENCOUNTER — Inpatient Hospital Stay
Admission: EM | Admit: 2023-07-02 | Discharge: 2023-07-07 | DRG: 806 | Disposition: A | Discharge status: Home / Self Care | Payer: BC Managed Care – PPO

## 2023-07-02 DIAGNOSIS — O1414 Severe pre-eclampsia complicating childbirth: Secondary | ICD-10-CM | POA: Diagnosis not present

## 2023-07-02 DIAGNOSIS — R634 Abnormal weight loss: Secondary | ICD-10-CM | POA: Diagnosis not present

## 2023-07-02 DIAGNOSIS — Z833 Family history of diabetes mellitus: Secondary | ICD-10-CM | POA: Diagnosis not present

## 2023-07-02 DIAGNOSIS — O3403 Maternal care for unspecified congenital malformation of uterus, third trimester: Secondary | ICD-10-CM | POA: Diagnosis present

## 2023-07-02 DIAGNOSIS — Z820 Family history of epilepsy and other diseases of the nervous system: Secondary | ICD-10-CM | POA: Diagnosis not present

## 2023-07-02 DIAGNOSIS — O1415 Severe pre-eclampsia, complicating the puerperium: Secondary | ICD-10-CM | POA: Diagnosis not present

## 2023-07-02 DIAGNOSIS — Z7982 Long term (current) use of aspirin: Secondary | ICD-10-CM | POA: Diagnosis not present

## 2023-07-02 DIAGNOSIS — O1493 Unspecified pre-eclampsia, third trimester: Secondary | ICD-10-CM | POA: Diagnosis not present

## 2023-07-02 DIAGNOSIS — Z818 Family history of other mental and behavioral disorders: Secondary | ICD-10-CM

## 2023-07-02 DIAGNOSIS — Q513 Bicornate uterus: Secondary | ICD-10-CM | POA: Diagnosis not present

## 2023-07-02 DIAGNOSIS — Z881 Allergy status to other antibiotic agents status: Secondary | ICD-10-CM

## 2023-07-02 DIAGNOSIS — Z9189 Other specified personal risk factors, not elsewhere classified: Secondary | ICD-10-CM | POA: Diagnosis not present

## 2023-07-02 DIAGNOSIS — Z3A37 37 weeks gestation of pregnancy: Secondary | ICD-10-CM | POA: Diagnosis not present

## 2023-07-02 DIAGNOSIS — O9081 Anemia of the puerperium: Secondary | ICD-10-CM | POA: Diagnosis not present

## 2023-07-02 DIAGNOSIS — Z801 Family history of malignant neoplasm of trachea, bronchus and lung: Secondary | ICD-10-CM

## 2023-07-02 DIAGNOSIS — D62 Acute posthemorrhagic anemia: Secondary | ICD-10-CM | POA: Diagnosis not present

## 2023-07-02 DIAGNOSIS — O1413 Severe pre-eclampsia, third trimester: Secondary | ICD-10-CM | POA: Diagnosis not present

## 2023-07-02 DIAGNOSIS — R030 Elevated blood-pressure reading, without diagnosis of hypertension: Secondary | ICD-10-CM | POA: Diagnosis present

## 2023-07-02 DIAGNOSIS — Z23 Encounter for immunization: Secondary | ICD-10-CM | POA: Diagnosis not present

## 2023-07-02 DIAGNOSIS — O1490 Unspecified pre-eclampsia, unspecified trimester: Secondary | ICD-10-CM | POA: Diagnosis present

## 2023-07-02 DIAGNOSIS — Z823 Family history of stroke: Secondary | ICD-10-CM | POA: Diagnosis not present

## 2023-07-02 DIAGNOSIS — O0993 Supervision of high risk pregnancy, unspecified, third trimester: Secondary | ICD-10-CM | POA: Diagnosis not present

## 2023-07-02 DIAGNOSIS — O163 Unspecified maternal hypertension, third trimester: Principal | ICD-10-CM | POA: Diagnosis present

## 2023-07-02 LAB — CBC
HCT: 32.8 % — ABNORMAL LOW (ref 36.0–46.0)
Hemoglobin: 11.9 g/dL — ABNORMAL LOW (ref 12.0–15.0)
MCH: 32.8 pg (ref 26.0–34.0)
MCHC: 36.3 g/dL — ABNORMAL HIGH (ref 30.0–36.0)
MCV: 90.4 fL (ref 80.0–100.0)
Platelets: 171 10*3/uL (ref 150–400)
RBC: 3.63 MIL/uL — ABNORMAL LOW (ref 3.87–5.11)
RDW: 14.2 % (ref 11.5–15.5)
WBC: 9.4 10*3/uL (ref 4.0–10.5)
nRBC: 0 % (ref 0.0–0.2)

## 2023-07-02 LAB — COMPREHENSIVE METABOLIC PANEL
ALT: 26 U/L (ref 0–44)
AST: 38 U/L (ref 15–41)
Albumin: 2.9 g/dL — ABNORMAL LOW (ref 3.5–5.0)
Alkaline Phosphatase: 130 U/L — ABNORMAL HIGH (ref 38–126)
Anion gap: 9 (ref 5–15)
BUN: 9 mg/dL (ref 6–20)
CO2: 21 mmol/L — ABNORMAL LOW (ref 22–32)
Calcium: 9.6 mg/dL (ref 8.9–10.3)
Chloride: 106 mmol/L (ref 98–111)
Creatinine, Ser: 0.64 mg/dL (ref 0.44–1.00)
GFR, Estimated: >60 mL/min (ref >60)
Glucose, Bld: 107 mg/dL — ABNORMAL HIGH (ref 70–99)
Potassium: 3.3 mmol/L — ABNORMAL LOW (ref 3.5–5.1)
Sodium: 136 mmol/L (ref 135–145)
Total Bilirubin: 0.3 mg/dL (ref 0.3–1.2)
Total Protein: 6.1 g/dL — ABNORMAL LOW (ref 6.5–8.1)

## 2023-07-02 LAB — TYPE AND SCREEN
ABO/RH(D): A POS
Antibody Screen: NEGATIVE

## 2023-07-02 LAB — PROTEIN / CREATININE RATIO, URINE
Creatinine, Urine: 45 mg/dL
Protein Creatinine Ratio: 0.38 mg/mg{creat} — ABNORMAL HIGH (ref 0.00–0.15)
Total Protein, Urine: 17 mg/dL

## 2023-07-02 MED ORDER — HYDRALAZINE HCL 20 MG/ML IJ SOLN: 10.0000 mg | INTRAMUSCULAR | Status: DC | PRN

## 2023-07-02 MED ORDER — ACETAMINOPHEN 325 MG PO TABS
650.0000 mg | ORAL_TABLET | ORAL | Status: DC | PRN
  Administered 2023-07-02 – 2023-07-03 (×3): 650 mg via ORAL
  Filled 2023-07-02 (×4): qty 2

## 2023-07-02 MED ORDER — OXYTOCIN-SODIUM CHLORIDE 30-0.9 UT/500ML-% IV SOLN
2.5000 [IU]/h | INTRAVENOUS | Status: DC
  Administered 2023-07-04: 2.5 [IU]/h via INTRAVENOUS

## 2023-07-02 MED ORDER — MISOPROSTOL 25 MCG QUARTER TABLET
25.0000 ug | ORAL_TABLET | ORAL | Status: AC
  Administered 2023-07-02 – 2023-07-03 (×4): 25 ug via VAGINAL
  Filled 2023-07-02 (×4): qty 1

## 2023-07-02 MED ORDER — LIDOCAINE HCL (PF) 1 % IJ SOLN
INTRAMUSCULAR | Status: AC
  Filled 2023-07-02: qty 30

## 2023-07-02 MED ORDER — LABETALOL HCL 5 MG/ML IV SOLN: 80.0000 mg | INTRAVENOUS | Status: DC | PRN

## 2023-07-02 MED ORDER — TERBUTALINE SULFATE 1 MG/ML IJ SOLN: 0.2500 mg | Freq: Once | INTRAMUSCULAR | Status: DC | PRN

## 2023-07-02 MED ORDER — BUTALBITAL-APAP-CAFFEINE 50-325-40 MG PO TABS
1.0000 | ORAL_TABLET | Freq: Four times a day (QID) | ORAL | Status: DC | PRN
  Administered 2023-07-02: 1 via ORAL
  Filled 2023-07-02: qty 1

## 2023-07-02 MED ORDER — OXYCODONE-ACETAMINOPHEN 5-325 MG PO TABS: 2.0000 | ORAL_TABLET | ORAL | Status: DC | PRN

## 2023-07-02 MED ORDER — OXYTOCIN-SODIUM CHLORIDE 30-0.9 UT/500ML-% IV SOLN
INTRAVENOUS | Status: AC
  Filled 2023-07-02: qty 500

## 2023-07-02 MED ORDER — AMMONIA AROMATIC IN INHA
RESPIRATORY_TRACT | Status: AC
  Filled 2023-07-02: qty 10

## 2023-07-02 MED ORDER — LABETALOL HCL 5 MG/ML IV SOLN: 40.0000 mg | INTRAVENOUS | Status: DC | PRN

## 2023-07-02 MED ORDER — SOD CITRATE-CITRIC ACID 500-334 MG/5ML PO SOLN: 30.0000 mL | ORAL | Status: DC | PRN

## 2023-07-02 MED ORDER — MAGNESIUM SULFATE 40 GM/1000ML IV SOLN
2.0000 g/h | INTRAVENOUS | Status: DC
  Administered 2023-07-03 – 2023-07-04 (×2): 2 g/h via INTRAVENOUS
  Filled 2023-07-02 (×3): qty 1000

## 2023-07-02 MED ORDER — OXYTOCIN-SODIUM CHLORIDE 30-0.9 UT/500ML-% IV SOLN
1.0000 m[IU]/min | INTRAVENOUS | Status: DC
  Administered 2023-07-03: 2 m[IU]/min via INTRAVENOUS

## 2023-07-02 MED ORDER — ONDANSETRON HCL 4 MG/2ML IJ SOLN
4.0000 mg | Freq: Four times a day (QID) | INTRAMUSCULAR | Status: DC | PRN
  Administered 2023-07-03 (×2): 4 mg via INTRAVENOUS
  Filled 2023-07-02 (×2): qty 2

## 2023-07-02 MED ORDER — OXYTOCIN 10 UNIT/ML IJ SOLN
INTRAMUSCULAR | Status: AC
  Filled 2023-07-02: qty 2

## 2023-07-02 MED ORDER — LACTATED RINGERS IV SOLN: INTRAVENOUS | Status: DC

## 2023-07-02 MED ORDER — OXYTOCIN BOLUS FROM INFUSION
333.0000 mL | Freq: Once | INTRAVENOUS | Status: AC
  Administered 2023-07-04: 333 mL via INTRAVENOUS

## 2023-07-02 MED ORDER — MISOPROSTOL 25 MCG QUARTER TABLET
25.0000 ug | ORAL_TABLET | ORAL | Status: AC
  Administered 2023-07-02 – 2023-07-03 (×4): 25 ug via ORAL
  Filled 2023-07-02 (×4): qty 1

## 2023-07-02 MED ORDER — MAGNESIUM SULFATE BOLUS VIA INFUSION
4.0000 g | Freq: Once | INTRAVENOUS | Status: AC
  Administered 2023-07-02: 4 g via INTRAVENOUS
  Filled 2023-07-02: qty 1000

## 2023-07-02 MED ORDER — FENTANYL CITRATE (PF) 100 MCG/2ML IJ SOLN
50.0000 ug | INTRAMUSCULAR | Status: DC | PRN
  Administered 2023-07-03: 50 ug via INTRAVENOUS
  Administered 2023-07-03: 100 ug via INTRAVENOUS
  Filled 2023-07-02 (×2): qty 2

## 2023-07-02 MED ORDER — OXYCODONE-ACETAMINOPHEN 5-325 MG PO TABS: 1.0000 | ORAL_TABLET | ORAL | Status: DC | PRN

## 2023-07-02 MED ORDER — CALCIUM GLUCONATE 10 % IV SOLN
INTRAVENOUS | Status: AC
  Filled 2023-07-02: qty 10

## 2023-07-02 MED ORDER — LIDOCAINE HCL (PF) 1 % IJ SOLN
30.0000 mL | INTRAMUSCULAR | Status: AC | PRN
  Administered 2023-07-04: 30 mL via SUBCUTANEOUS

## 2023-07-02 MED ORDER — LACTATED RINGERS IV SOLN: 500.0000 mL | INTRAVENOUS | Status: DC | PRN

## 2023-07-02 MED ORDER — MISOPROSTOL 200 MCG PO TABS
ORAL_TABLET | ORAL | Status: AC
  Filled 2023-07-02: qty 4

## 2023-07-02 MED ORDER — POTASSIUM CHLORIDE CRYS ER 10 MEQ PO TBCR
20.0000 meq | EXTENDED_RELEASE_TABLET | Freq: Two times a day (BID) | ORAL | Status: DC
  Administered 2023-07-02 – 2023-07-04 (×2): 20 meq via ORAL
  Filled 2023-07-02 (×2): qty 2

## 2023-07-02 MED ORDER — LABETALOL HCL 5 MG/ML IV SOLN: 20.0000 mg | INTRAVENOUS | Status: DC | PRN

## 2023-07-02 MED ORDER — CALCIUM CARBONATE ANTACID 500 MG PO CHEW
2.0000 | CHEWABLE_TABLET | Freq: Once | ORAL | Status: AC | PRN
  Administered 2023-07-02: 400 mg via ORAL
  Filled 2023-07-02: qty 2

## 2023-07-02 MED ORDER — LABETALOL HCL 5 MG/ML IV SOLN
20.0000 mg | INTRAVENOUS | Status: DC | PRN
  Administered 2023-07-02 – 2023-07-03 (×2): 20 mg via INTRAVENOUS
  Filled 2023-07-02 (×2): qty 4

## 2023-07-02 NOTE — Progress Notes (Signed)
Patient ID: Kayla Townsend, female   DOB: 06/29/1986, 37 y.o.   MRN: [id number] Agree with admission for preeclampsia with severe features ( BP ) . On magnesium and induction under way. Cat 1 fetal monitoring  Replace K+

## 2023-07-02 NOTE — Progress Notes (Signed)
Labor Progress Note  Kayla Townsend is a 37 y.o. G2P0010 at [redacted]w[redacted]d by LMP admitted for induction of labor due to Pre-e with severe features.  Subjective: Pt is comfortable and trying to sleep  Objective: BP 135/73 (BP Location: Left Arm)   Pulse 90   Temp 97.8 F (36.6 C) (Oral)   Resp 18   Ht 5\' 6"  (1.676 m)   Wt 93.4 kg   SpO2 93%   BMI 33.25 kg/m   Fetal Assessment: FHT:  FHR: 125 bpm, variability: moderate,  accelerations:  Present,  decelerations:  Absent Category/reactivity:  Category I UC:   none SVE:    Dilation: Closed  Effacement: 50%  Station:  -3  Consistency: soft  Position: posterior  Membrane status: Intact Amniotic color: n/a  Labs: Lab Results  Component Value Date   WBC 9.4 07/02/2023   HGB 11.9 (L) 07/02/2023   HCT 32.8 (L) 07/02/2023   MCV 90.4 07/02/2023   PLT 171 07/02/2023    Assessment / Plan: Induction of labor due to preeclampsia,  progressing well on cytotec 1645 Mag Sulfate bolus started  1802 0/10/-3 1815 Cytotec 25 mcg PO and PV 1845 Labetalol 20mg  given 2207 0/50/-3 2209 Cytotec 25 mcg PO and PV Ordered Kdur to continue her K+ supplementation  Labor: Progressing normally Preeclampsia:  on magnesium sulfate, labs stable, and 135/73 Fetal Wellbeing:  Category I Pain Control:  Labor support without medications I/D:   Afebrile, GBS neg, Intact Anticipated MOD:  NSVD  Cyril Mourning, CNM 07/02/2023, 10:24 PM

## 2023-07-02 NOTE — OB Triage Note (Signed)
Pt presents to L/D triage from home with reported elevated Bps at home. Pt reports no PIH symptoms at this time. +2 reflexes, no clonus. Generalized edema, +2 edema on the lower extremities.  Monitors applied and assessing. Blood pressures cycling q15 min. Labs pending and cnm notified of patient's arrival.

## 2023-07-02 NOTE — H&P (Signed)
OB History & Physical   History of Present Illness:   Chief Complaint: Elevated BPs  HPI:  Kayla Townsend is a 37 y.o. G2P0010 female at [redacted]w[redacted]d, LMP 10/16/22, consistent with [place] at 8 week, with Estimated Date of Delivery: 07/23/23.  She presents to L&D for induction of labor due to pre-E with severe features.  Reports active fetal movement  Contractions: denies  LOF/SROM: denies Vaginal bleeding: denies  Factors complicating pregnancy:  Gestational HTN Bicornate uterus  AMA History of anxiety    Patient Active Problem List   Diagnosis Date Noted   Pregnancy 06/20/2023   Elevated blood pressure affecting pregnancy in third trimester, antepartum 06/20/2023   Supervision of high risk pregnancy in first trimester 12/11/2022   Supervision of high risk pregnancy in third trimester 12/11/2022   Rubella non-immune status, antepartum 07/03/2022   Need for MMR vaccine 07/03/2022   History of kidney stones 04/27/2021   Healthcare maintenance 04/27/2021   History of anxiety disorder 05/19/2020   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/19/2020   ASCUS with positive high risk HPV cervical 04/21/2020    Prenatal Transfer Tool  Maternal Diabetes: No Genetic Screening: Declined Maternal Ultrasounds/Referrals: Normal Fetal Ultrasounds or other Referrals:  None Maternal Substance Abuse:  No Significant Maternal Medications:  None Significant Maternal Lab Results: None  Maternal Medical History:   Past Medical History:  Diagnosis Date   Abnormal Pap smear of vagina    Allergy    Anxiety    Cervical high risk human papillomavirus (HPV) DNA test positive 03/2018   Cholelithiases 03/2018   on renal u/s, no sx   Kidney stone    Miscarriage 06/2022    Past Surgical History:  Procedure Laterality Date   WISDOM TOOTH EXTRACTION      Allergies  Allergen Reactions   Ceclor [Cefaclor] Swelling    As a child    Prior to Admission medications   Medication Sig Start Date End Date  Taking? Authorizing Provider  aspirin EC 81 MG tablet Take 81 mg by mouth daily. Swallow whole.   Yes [provider]  ondansetron (ZOFRAN) 4 MG tablet Take 4 mg by mouth every 12 (twelve) hours as needed for nausea. 01/08/23  Yes [provider]  Prenatal Vit-Fe Fumarate-FA (PRENATAL MULTIVITAMIN) TABS tablet Take 1 tablet by mouth daily at 12 noon.   Yes [provider]  potassium chloride (KLOR-CON M) 10 MEQ tablet Take 1 tablet (10 mEq total) by mouth daily for 5 days. 06/08/23 06/13/23  Marjie Skiff, NP     Prenatal care site:  [facility]  OB History  Gravida Para Term Preterm AB Living  2 0 0 0 1 0  SAB IAB Ectopic Multiple Live Births  1 0 0 0 0    # Outcome Date GA Lbr Len/2nd Weight Sex Type Anes PTL Lv  2 Current           1 SAB 06/13/22 [redacted]w[redacted]d            Social History: She  reports that she has never smoked. She has never used smokeless tobacco. She reports that she does not currently use alcohol. She reports that she does not use drugs.  Family History: family history includes Alzheimer's disease in her maternal grandfather and paternal grandmother; Anxiety disorder in her brother and mother; Atrial fibrillation in her maternal grandmother; Bipolar disorder in her father; Dementia in her father; Diabetes in her father and maternal grandfather; Hyperlipidemia in her mother; Lung cancer (  age of onset: 20) in her paternal grandfather; Stroke (age of onset: 98) in her father.   Review of Systems: A full review of systems was performed and negative except as noted in the HPI.     Physical Exam:  Vital Signs: BP (!) 149/99   Pulse 82   Temp 98.2 F (36.8 C) (Oral)   Resp 18   Ht 5\' 6"  (1.676 m)   Wt 93.4 kg   BMI 33.25 kg/m   General: no acute distress.  HEENT: normocephalic, atraumatic Heart: regular rate & rhythm Lungs: normal respiratory effort Abdomen: soft, gravid, non-tender;  EFW: per u/s on 06/20/23   2,943 g   79%   6 lb  8  oz  Pelvic:   External: Normal external female genitalia  Cervix:   /   /      Extremities: non-tender, symmetric, no edema bilaterally.   Neurologic: Alert & oriented x 3.    Results for orders placed or performed during the hospital encounter of 07/02/23 (from the past 24 hour(s))  Protein / creatinine ratio, urine     Status: Abnormal   Collection Time: 07/02/23 12:13 PM  Result Value Ref Range   Creatinine, Urine 45 mg/dL   Total Protein, Urine 17 mg/dL   Protein Creatinine Ratio 0.38 (H) 0.00 - 0.15 mg/mg[Cre]  Type and screen [facility]     Status: None   Collection Time: 07/02/23  1:07 PM  Result Value Ref Range   ABO/RH(D) A POS    Antibody Screen NEG    Sample Expiration      07/05/2023,2359 Performed at [facility], [address] [place], [place] [address]   CBC     Status: Abnormal   Collection Time: 07/02/23  1:11 PM  Result Value Ref Range   WBC 9.4 4.0 - 10.5 K/uL   RBC 3.63 (L) 3.87 - 5.11 MIL/uL   Hemoglobin 11.9 (L) 12.0 - 15.0 g/dL   HCT 62.1 (L) 30.8 - 65.7 %   MCV 90.4 80.0 - 100.0 fL   MCH 32.8 26.0 - 34.0 pg   MCHC 36.3 (H) 30.0 - 36.0 g/dL   RDW 84.6 96.2 - 95.2 %   Platelets 171 150 - 400 K/uL   nRBC 0.0 0.0 - 0.2 %  Comprehensive metabolic panel     Status: Abnormal   Collection Time: 07/02/23  1:11 PM  Result Value Ref Range   Sodium 136 135 - 145 mmol/L   Potassium 3.3 (L) 3.5 - 5.1 mmol/L   Chloride 106 98 - 111 mmol/L   CO2 21 (L) 22 - 32 mmol/L   Glucose, Bld 107 (H) 70 - 99 mg/dL   BUN 9 6 - 20 mg/dL   Creatinine, Ser 8.41 0.44 - 1.00 mg/dL   Calcium 9.6 8.9 - 32.4 mg/dL   Total Protein 6.1 (L) 6.5 - 8.1 g/dL   Albumin 2.9 (L) 3.5 - 5.0 g/dL   AST 38 15 - 41 U/L   ALT 26 0 - 44 U/L   Alkaline Phosphatase 130 (H) 38 - 126 U/L   Total Bilirubin 0.3 0.3 - 1.2 mg/dL   GFR, Estimated >40 >10 mL/min   Anion gap 9 5 - 15    Pertinent Results:  Prenatal Labs: Blood type/Rh A POS   Antibody screen  Negative    Rubella Immune    Varicella Immune  RPR Neg    HBsAg Neg   Hep C NR  HIV Neg    GC neg  Chlamydia neg  Genetic screening declined  1 hour GTT 120  3 hour GTT N/a  GBS neg     FHT:  FHR: 130 bpm, variability: moderate,  accelerations:  Present,  decelerations:  Absent Category/reactivity:  Category I UC:   irregular   Cephalic by SVE   Assessment:  Kayla Townsend is a 37 y.o. G82P0010 female at [redacted]w[redacted]d with pre-e with severe features.   Plan:  1. Admit to Labor & Delivery - consents reviewed and obtained - .Dr Beverly Gust MD notified of admission and plan of care   2. Fetal Well being  - Fetal Tracing: Cat I - Group B Streptococcus ppx not indicated: GBS negative - Presentation: vtx confirmed by sve   3. Routine OB: - Prenatal labs reviewed, as above - Rh positive - CBC, T&S, RPR on admit - Regular diet, continuous IV fluids  4. Induction of labor  - Contractions monitored with external toco - Pelvis adequate for trial of labor  - Plan for induction with misoprostol  - Induction with misoprostol, oxytocin, and AROM as appropriate  - Plan for  continuous fetal monitoring - Maternal pain control as desired; planning regional anesthesia - Anticipate vaginal delivery  5. Post Partum Planning: - Infant feeding: breast feeding - Contraception:  TBD - Tdap vaccine: Given prenatally - Flu vaccine:  declined prenatally -RSV vaccine:RSV Given: Given during pregnancy >/=14 days ago on 06/05/23  Cyril Mourning, CNM 07/02/23 3:29 PM

## 2023-07-03 ENCOUNTER — Inpatient Hospital Stay: Payer: BC Managed Care – PPO | Admitting: Anesthesiology

## 2023-07-03 LAB — CBC
HCT: 38.1 % (ref 36.0–46.0)
Hemoglobin: 13.4 g/dL (ref 12.0–15.0)
MCH: 32 pg (ref 26.0–34.0)
MCHC: 35.2 g/dL (ref 30.0–36.0)
MCV: 90.9 fL (ref 80.0–100.0)
Platelets: 184 10*3/uL (ref 150–400)
RBC: 4.19 MIL/uL (ref 3.87–5.11)
RDW: 14.4 % (ref 11.5–15.5)
WBC: 13.4 10*3/uL — ABNORMAL HIGH (ref 4.0–10.5)
nRBC: 0 % (ref 0.0–0.2)

## 2023-07-03 LAB — RPR: RPR Ser Ql: NONREACTIVE

## 2023-07-03 MED ORDER — LIDOCAINE HCL (PF) 1 % IJ SOLN
INTRAMUSCULAR | Status: DC | PRN
  Administered 2023-07-03 (×2): 3 mL via SUBCUTANEOUS

## 2023-07-03 MED ORDER — EPHEDRINE 5 MG/ML INJ
10.0000 mg | INTRAVENOUS | Status: DC | PRN
  Administered 2023-07-03: 10 mg via INTRAVENOUS
  Filled 2023-07-03: qty 5

## 2023-07-03 MED ORDER — PHENYLEPHRINE 80 MCG/ML (10ML) SYRINGE FOR IV PUSH (FOR BLOOD PRESSURE SUPPORT)
80.0000 ug | PREFILLED_SYRINGE | INTRAVENOUS | Status: DC | PRN
  Administered 2023-07-03 (×2): 80 ug via INTRAVENOUS
  Filled 2023-07-03: qty 10

## 2023-07-03 MED ORDER — DIPHENHYDRAMINE HCL 50 MG/ML IJ SOLN: 12.5000 mg | INTRAMUSCULAR | Status: DC | PRN

## 2023-07-03 MED ORDER — LACTATED RINGERS IV SOLN
500.0000 mL | Freq: Once | INTRAVENOUS | Status: AC
  Administered 2023-07-03: 500 mL via INTRAVENOUS

## 2023-07-03 MED ORDER — EPHEDRINE 5 MG/ML INJ
10.0000 mg | INTRAVENOUS | Status: DC | PRN
  Administered 2023-07-03: 10 mg via INTRAVENOUS

## 2023-07-03 MED ORDER — PHENYLEPHRINE 80 MCG/ML (10ML) SYRINGE FOR IV PUSH (FOR BLOOD PRESSURE SUPPORT)
80.0000 ug | PREFILLED_SYRINGE | INTRAVENOUS | Status: DC | PRN
  Administered 2023-07-03: 80 ug via INTRAVENOUS

## 2023-07-03 MED ORDER — FENTANYL-BUPIVACAINE-NACL 0.5-0.125-0.9 MG/250ML-% EP SOLN
12.0000 mL/h | EPIDURAL | Status: DC | PRN
  Administered 2023-07-03: 12 mL/h via EPIDURAL
  Filled 2023-07-03 (×2): qty 250

## 2023-07-03 MED ORDER — LIDOCAINE-EPINEPHRINE (PF) 1.5 %-1:200000 IJ SOLN
INTRAMUSCULAR | Status: DC | PRN
  Administered 2023-07-03 (×2): 3 mL via EPIDURAL

## 2023-07-03 MED ORDER — SODIUM CHLORIDE 0.9 % IV SOLN
INTRAVENOUS | Status: DC | PRN
  Administered 2023-07-03 (×4): 5 mL via EPIDURAL

## 2023-07-03 MED ORDER — CALCIUM CARBONATE ANTACID 500 MG PO CHEW
2.0000 | CHEWABLE_TABLET | Freq: Once | ORAL | Status: AC | PRN
  Administered 2023-07-03: 400 mg via ORAL

## 2023-07-03 MED ORDER — CALCIUM CARBONATE ANTACID 500 MG PO CHEW
CHEWABLE_TABLET | ORAL | Status: AC
  Filled 2023-07-03: qty 2

## 2023-07-03 NOTE — Progress Notes (Signed)
Labor Check  Subj:  has no unusual complaints  Obj:   Dose (milli-units/min) Oxytocin: 8 milli-units/min  Cervix: Dilation: 8 / Effacement (%): 60, 70 / Station: 0  Baseline [id number]: 135 bpm, Variability: Fair (1-6 bpm), Accelerations: absent, and Decelerations: Early, intermittent late Contractions: regular, every 2-5 minutes    Current Vital Signs 24h Vital Sign Ranges  T 98.1 F (36.7 C) Temp  Avg: 98 F (36.7 C)  Min: 97.7 F (36.5 C)  Max: 98.2 F (36.8 C)  BP 128/78 BP  Min: 113/88  Max: 166/102  HR 85 Pulse  Avg: 85.9  Min: 72  Max: 97  RR 16 Resp  Avg: 16.4  Min: 14  Max: 20  SaO2 95 %   SpO2  Avg: 94.8 %  Min: 92 %  Max: 99 %       Medications SCHEDULED MEDICATIONS   oxytocin 40 units in LR 1000 mL  333 mL Intravenous Once   potassium chloride  20 mEq Oral BID    MEDICATION INFUSIONS   fentaNYL 2 mcg/mL w/bupivacaine 0.125% in NS 250 mL 12 mL/hr (07/03/23 2004)   lactated ringers     lactated ringers Stopped (07/03/23 1745)   lactated ringers     magnesium sulfate 2 g/hr (07/03/23 1900)   oxytocin     oxytocin 8 milli-units/min (07/03/23 1900)    PRN MEDICATIONS  acetaminophen, butalbital-acetaminophen-caffeine, diphenhydrAMINE, ePHEDrine, ePHEDrine, fentaNYL (SUBLIMAZE) injection, fentaNYL 2 mcg/mL w/bupivacaine 0.125% in NS 250 mL, labetalol **AND** labetalol **AND** labetalol **AND** hydrALAZINE **AND** Measure blood pressure, lactated ringers, lidocaine (PF), ondansetron, oxyCODONE-acetaminophen, oxyCODONE-acetaminophen, phenylephrine, phenylephrine, sodium citrate-citric acid, terbutaline    A/P: 37 y.o. G2P0010 female at [redacted]w[redacted]d with Gestational Hypertension and Preeclampsia  1.  Labor: Active phase labor. and Satisfactory labor progress. preeclampsia on magnesium sulfate, no signs or symptoms of toxicity, intake and ouput balanced, and labs stable and pain controlled  Epidural 2.  [id number] assessment: Category II 3.  Group B Strep negative 4.  Membranes ruptured, bloody show 5.  Pain: receiving treatment 6.  Recheck:Evaluated by digital exam. 7. Continue present management., Anticipate vaginal delivery., and Intervention: IV Pitocin augmentation, IV fluid bolus, and change maternal position  Kayla Townsend Avala 07/03/2023 8:17 PM

## 2023-07-03 NOTE — Anesthesia Procedure Notes (Signed)
Epidural Patient location during procedure: OB Start time: 07/03/2023 5:42 PM End time: 07/03/2023 5:45 PM  Staffing Anesthesiologist: Keandre Linden, Cleda Mccreedy, MD Performed: anesthesiologist   Preanesthetic Checklist Completed: patient identified, IV checked, site marked, risks and benefits discussed, surgical consent, monitors and equipment checked, pre-op evaluation and timeout performed  Epidural Patient position: sitting Prep: ChloraPrep Patient monitoring: heart rate, continuous pulse ox and blood pressure Approach: midline Location: L3-L4 Injection technique: LOR saline  Needle:  Needle type: Tuohy  Needle gauge: 17 G Needle length: 9 cm and 9 Needle insertion depth: 6 cm Catheter type: closed end flexible Catheter size: 19 Gauge Catheter at skin depth: 12 cm Test dose: negative and 1.5% lidocaine with Epi 1:200 K  Assessment Sensory level: T10 Events: blood not aspirated, no cerebrospinal fluid, injection not painful, no injection resistance, no paresthesia and negative IV test  Additional Notes 1 attempt Pt. Evaluated and documentation done after procedure finished. Patient identified. Risks/Benefits/Options discussed with patient including but not limited to bleeding, infection, nerve damage, paralysis, failed block, incomplete pain control, headache, blood pressure changes, nausea, vomiting, reactions to medication both or allergic, itching and postpartum back pain. Confirmed with bedside nurse the patient's most recent platelet count. Confirmed with patient that they are not currently taking any anticoagulation, have any bleeding history or any family history of bleeding disorders. Patient expressed understanding and wished to proceed. All questions were answered. Sterile technique was used throughout the entire procedure. Please see nursing notes for vital signs. Test dose was given through epidural catheter and negative prior to continuing to dose epidural or start  infusion. Warning signs of high block given to the patient including shortness of breath, tingling/numbness in hands, complete motor block, or any concerning symptoms with instructions to call for help. Patient was given instructions on fall risk and not to get out of bed. All questions and concerns addressed with instructions to call with any issues or inadequate analgesia.    Patient tolerated the insertion well without immediate complications.Reason for block:procedure for pain

## 2023-07-03 NOTE — Anesthesia Preprocedure Evaluation (Signed)
Anesthesia Evaluation  Patient identified by MRN, date of birth, ID band Patient awake    Reviewed: Allergy & Precautions, NPO status , Patient's Chart, lab work & pertinent test results  History of Anesthesia Complications Negative for: history of anesthetic complications  Airway Mallampati: III  TM Distance: >3 FB Neck ROM: full    Dental  (+) Chipped   Pulmonary neg pulmonary ROS   Pulmonary exam normal        Cardiovascular Exercise Tolerance: Good hypertension, Normal cardiovascular exam     Neuro/Psych    GI/Hepatic negative GI ROS,,,  Endo/Other    Renal/GU Renal disease  negative genitourinary   Musculoskeletal   Abdominal   Peds  Hematology negative hematology ROS (+)   Anesthesia Other Findings PreE  Past Medical History: No date: Abnormal Pap smear of vagina No date: Allergy No date: Anxiety 03/2018: Cervical high risk human papillomavirus (HPV) DNA test  positive 03/2018: Cholelithiases     Comment:  on renal u/s, no sx No date: Kidney stone 06/2022: Miscarriage  Past Surgical History: No date: WISDOM TOOTH EXTRACTION  BMI    Body Mass Index: 33.25 kg/m      Reproductive/Obstetrics (+) Pregnancy                             Anesthesia Physical Anesthesia Plan  ASA: 3  Anesthesia Plan: Epidural   Post-op Pain Management:    Induction:   PONV Risk Score and Plan:   Airway Management Planned: Natural Airway  Additional Equipment:   Intra-op Plan:   Post-operative Plan:   Informed Consent: I have reviewed the patients History and Physical, chart, labs and discussed the procedure including the risks, benefits and alternatives for the proposed anesthesia with the patient or authorized representative who has indicated his/her understanding and acceptance.     Dental Advisory Given  Plan Discussed with: Anesthesiologist  Anesthesia Plan Comments:  (Patient reports no bleeding problems and no anticoagulant use.   Patient consented for risks of anesthesia including but not limited to:  - adverse reactions to medications - risk of bleeding, infection and or nerve damage from epidural that could lead to paralysis - risk of headache or failed epidural - nerve damage due to positioning - that if epidural is used for C-section that there is a chance of epidural failure requiring spinal placement or conversion to GA - Damage to heart, brain, lungs, other parts of body or loss of life  Patient voiced understanding.)       Anesthesia Quick Evaluation

## 2023-07-03 NOTE — Progress Notes (Signed)
Labor Check  Subj:  complains of headache 2/10 pain rating  Obj:      Cervix: Dilation: Closed / Effacement (%): 60 / Station: -3  Baseline WJX:BJYNWGNF: 125 bpm, Variability: moderate, with periods of minimal, Accelerations: Reactive, and Decelerations: Absent Contractions: regular, every 2-3 minutes Overall assessment: reassuring   Current Vital Signs 24h Vital Sign Ranges  T 98.2 F (36.8 C) Temp  Avg: 98 F (36.7 C)  Min: 97.7 F (36.5 C)  Max: 98.3 F (36.8 C)  BP (!) 153/87 BP  Min: 113/88  Max: 168/99  HR 81 Pulse  Avg: 87.7  Min: 79  Max: 97  RR 15 Resp  Avg: 16.6  Min: 14  Max: 18  SaO2 99 %   SpO2  Avg: 95.1 %  Min: 92 %  Max: 99 %       Medications SCHEDULED MEDICATIONS   calcium carbonate       oxytocin 40 units in LR 1000 mL  333 mL Intravenous Once   potassium chloride  20 mEq Oral BID    MEDICATION INFUSIONS   lactated ringers     lactated ringers 75 mL/hr at 07/03/23 1014   lactated ringers     magnesium sulfate 2 g/hr (07/03/23 1014)   oxytocin     oxytocin      PRN MEDICATIONS  acetaminophen, butalbital-acetaminophen-caffeine, calcium carbonate, fentaNYL (SUBLIMAZE) injection, labetalol **AND** labetalol **AND** labetalol **AND** hydrALAZINE **AND** Measure blood pressure, lactated ringers, lidocaine (PF), ondansetron, oxyCODONE-acetaminophen, oxyCODONE-acetaminophen, sodium citrate-citric acid, terbutaline    A/P: 37 y.o. G2P0010 female at [redacted]w[redacted]d with Preeclampsia  1.  Labor: Early latent labor. and Inadequate uterine activity - intensity or frequency. preeclampsia on magnesium sulfate, no signs or symptoms of toxicity, intake and ouput balanced, and labs stable and pain controlled  Epidural, IV pain meds, and Nitrous Oxide 2.  AOZ:HYQMVHQ assessment: Category I 3.  Group B Strep negative 4. Membranes intact 5.  Pain: headaches, present - adequately treated, and level of pain (1-10, 10 severe), 2/10 6.  Recheck:Not evaluated. 7. Intervention:  discussed with patient and FOB use of Cooks Cervical Cath and low dose Pitocin. Patient already received Cytotec x 4 doses, will now need other method of induction/augmentation. Patient requested time to think about options.  Chari Manning [facility] 07/03/2023 10:56 AM

## 2023-07-03 NOTE — Anesthesia Procedure Notes (Signed)
Epidural Patient location during procedure: OB Start time: 07/03/2023 7:50 PM End time: 07/03/2023 7:56 PM  Staffing Anesthesiologist: Khandi Kernes, Cleda Mccreedy, MD Performed: anesthesiologist   Preanesthetic Checklist Completed: patient identified, IV checked, site marked, risks and benefits discussed, surgical consent, monitors and equipment checked, pre-op evaluation and timeout performed  Epidural Patient position: sitting Prep: ChloraPrep Patient monitoring: heart rate, continuous pulse ox and blood pressure Approach: midline Location: L2-L3 Injection technique: LOR saline  Needle:  Needle type: Tuohy  Needle gauge: 17 G Needle length: 9 cm and 9 Needle insertion depth: 5 cm Catheter type: closed end flexible Catheter size: 19 Gauge Catheter at skin depth: 11 cm Test dose: negative and 1.5% lidocaine with Epi 1:200 K  Assessment Sensory level: T10 Events: blood not aspirated, no cerebrospinal fluid, injection not painful, no injection resistance, no paresthesia and negative IV test  Additional Notes 1 attempt Pt. Evaluated and documentation done after procedure finished. Patient identified. Risks/Benefits/Options discussed with patient including but not limited to bleeding, infection, nerve damage, paralysis, failed block, incomplete pain control, headache, blood pressure changes, nausea, vomiting, reactions to medication both or allergic, itching and postpartum back pain. Confirmed with bedside nurse the patient's most recent platelet count. Confirmed with patient that they are not currently taking any anticoagulation, have any bleeding history or any family history of bleeding disorders. Patient expressed understanding and wished to proceed. All questions were answered. Sterile technique was used throughout the entire procedure. Please see nursing notes for vital signs. Test dose was given through epidural catheter and negative prior to continuing to dose epidural or start  infusion. Warning signs of high block given to the patient including shortness of breath, tingling/numbness in hands, complete motor block, or any concerning symptoms with instructions to call for help. Patient was given instructions on fall risk and not to get out of bed. All questions and concerns addressed with instructions to call with any issues or inadequate analgesia.    Patient tolerated the insertion well without immediate complications.Reason for block:procedure for pain

## 2023-07-03 NOTE — Progress Notes (Signed)
Labor Check  Subj:  has no unusual complaints  Obj:   Dose (milli-units/min) Oxytocin: 12 milli-units/min  Cervix: Dilation: 10 / Effacement (%): 100 / Station: Plus 1  Baseline ZOX:WRUEAVWU: 135 bpm, Variability: Fair (1-6 bpm), Accelerations: absent , and Decelerations: Variable: late component: late onset, intermittent Contractions: regular, every 3-5 minutes    Current Vital Signs 24h Vital Sign Ranges  T 98.1 F (36.7 C) Temp  Avg: 98 F (36.7 C)  Min: 97.7 F (36.5 C)  Max: 98.2 F (36.8 C)  BP (!) 102/46 BP  Min: 83/40  Max: 166/102  HR 98 Pulse  Avg: 85.7  Min: 72  Max: 98  RR 14 Resp  Avg: 16.2  Min: 14  Max: 20  SaO2 95 %   SpO2  Avg: 94.6 %  Min: 92 %  Max: 99 %       Medications SCHEDULED MEDICATIONS   oxytocin 40 units in LR 1000 mL  333 mL Intravenous Once   potassium chloride  20 mEq Oral BID    MEDICATION INFUSIONS   fentaNYL 2 mcg/mL w/bupivacaine 0.125% in NS 250 mL 12 mL/hr (07/03/23 2100)   lactated ringers     lactated ringers 65 mL/hr at 07/03/23 2100   lactated ringers     magnesium sulfate 2 g/hr (07/03/23 2100)   oxytocin     oxytocin 12 milli-units/min (07/03/23 2109)    PRN MEDICATIONS  acetaminophen, butalbital-acetaminophen-caffeine, diphenhydrAMINE, ePHEDrine, ePHEDrine, fentaNYL (SUBLIMAZE) injection, fentaNYL 2 mcg/mL w/bupivacaine 0.125% in NS 250 mL, labetalol **AND** labetalol **AND** labetalol **AND** hydrALAZINE **AND** Measure blood pressure, lactated ringers, lidocaine (PF), ondansetron, oxyCODONE-acetaminophen, oxyCODONE-acetaminophen, phenylephrine, phenylephrine, sodium citrate-citric acid, terbutaline    A/P: 37 y.o. G2P0010 female at [redacted]w[redacted]d with Preeclampsia  1.  Labor: Satisfactory labor progress. preeclampsia on magnesium sulfate, no signs or symptoms of toxicity, labs stable, and urine output decreased over the last 2 hours with total 55 cc ., fetal wellbBeing Category II, and pain controlled  Epidural 2.  JWJ:XBJYNWG  assessment: Category II 3.  Group B Strep negative 4. Membranes ruptured 5.  Pain: receiving treatment 6.  Recheck:c/c/+1 7. Labor down, epidural decreased to 8   Dr Jean Rosenthal notified and aware of labor events and continued POC  Chari Manning [facility] 07/03/2023 10:06 PM

## 2023-07-03 NOTE — Progress Notes (Signed)
Labor Check  Subj:  complains of headache and nausea   Obj:      Cervix: Dilation: 1.5 / Effacement (%): 60 / Station: -3  Baseline BMW:UXLKGMWN: 130 bpm, Variability: Good {> 6 bpm), Accelerations: Reactive, and Decelerations: Absent Contractions: regular, every 4-5 minutes Overall assessment: reassuring    Current Vital Signs 24h Vital Sign Ranges  T 98.2 F (36.8 C) Temp  Avg: 98 F (36.7 C)  Min: 97.7 F (36.5 C)  Max: 98.3 F (36.8 C)  BP (!) 150/85 BP  Min: 113/88  Max: 168/99  HR 85 Pulse  Avg: 87.5  Min: 79  Max: 97  RR 18 Resp  Avg: 16.4  Min: 14  Max: 18  SaO2 95 %   SpO2  Avg: 95 %  Min: 92 %  Max: 99 %       Medications SCHEDULED MEDICATIONS   calcium carbonate       oxytocin 40 units in LR 1000 mL  333 mL Intravenous Once   potassium chloride  20 mEq Oral BID    MEDICATION INFUSIONS   lactated ringers     lactated ringers 75 mL/hr at 07/03/23 1330   lactated ringers     magnesium sulfate 2 g/hr (07/03/23 1330)   oxytocin     oxytocin      PRN MEDICATIONS  acetaminophen, butalbital-acetaminophen-caffeine, calcium carbonate, fentaNYL (SUBLIMAZE) injection, labetalol **AND** labetalol **AND** labetalol **AND** hydrALAZINE **AND** Measure blood pressure, lactated ringers, lidocaine (PF), ondansetron, oxyCODONE-acetaminophen, oxyCODONE-acetaminophen, sodium citrate-citric acid, terbutaline    A/P: 37 y.o. G2P0010 female at [redacted]w[redacted]d with Preeclampsia  1.  Labor: Early latent labor., Inadequate uterine activity - intensity or frequency., and Significant vaginal bleeding bloody show and coming from cervix. preeclampsia on magnesium sulfate, no signs or symptoms of toxicity, intake and ouput balanced, labs stable, and urinary catheter placed and pain controlled  IV pain meds 2.  UUV:OZDGUYQ assessment: Category I 3.  Group B Strep negative 4. Membranes intact 5.  Pain: headaches and present - adequately treated 6.  Recheck:Evaluated by digital exam. 7. Anticipate  vaginal delivery. and Intervention: IV Pitocin augmentation, change maternal position, and Cooks Cath placed  Chari Manning CNM 07/03/2023 1:32 PM

## 2023-07-03 NOTE — Progress Notes (Signed)
L&D Note    Subjective:  complains of headache and feeling pain with contractions  Objective:   Vitals:   07/03/23 1855 07/03/23 1856 07/03/23 1900 07/03/23 1911  BP:  129/81  128/78  Pulse:  83  85  Resp:   16   Temp:    98.1 F (36.7 C)  TempSrc:    Oral  SpO2: 95%     Weight:      Height:        Current Vital Signs 24h Vital Sign Ranges  T 98.1 F (36.7 C) Temp  Avg: 98 F (36.7 C)  Min: 97.7 F (36.5 C)  Max: 98.2 F (36.8 C)  BP 128/78 BP  Min: 113/88  Max: 166/102  HR 85 Pulse  Avg: 85.9  Min: 72  Max: 97  RR 16 Resp  Avg: 16.4  Min: 14  Max: 20  SaO2 95 %   SpO2  Avg: 94.8 %  Min: 92 %  Max: 99 %      Gen: alert, cooperative FHR: Baseline: 125 bpm, Variability: moderate, Accels: Present, Decels: variable Toco: regular, every 2-3 minutes SVE: Dilation: 5.5 Effacement (%): 60, 70 Cervical Position: Posterior Station: -3 Presentation: Undeterminable Exam by:: Rubye Oaks CNM  Medications SCHEDULED MEDICATIONS   oxytocin 40 units in LR 1000 mL  333 mL Intravenous Once   potassium chloride  20 mEq Oral BID    MEDICATION INFUSIONS   fentaNYL 2 mcg/mL w/bupivacaine 0.125% in NS 250 mL 12 mL/hr (07/03/23 1900)   lactated ringers     lactated ringers Stopped (07/03/23 1745)   lactated ringers     magnesium sulfate 2 g/hr (07/03/23 1900)   oxytocin     oxytocin 8 milli-units/min (07/03/23 1900)    PRN MEDICATIONS  acetaminophen, butalbital-acetaminophen-caffeine, diphenhydrAMINE, ePHEDrine, ePHEDrine, fentaNYL (SUBLIMAZE) injection, fentaNYL 2 mcg/mL w/bupivacaine 0.125% in NS 250 mL, labetalol **AND** labetalol **AND** labetalol **AND** hydrALAZINE **AND** Measure blood pressure, lactated ringers, lidocaine (PF), ondansetron, oxyCODONE-acetaminophen, oxyCODONE-acetaminophen, phenylephrine, phenylephrine, sodium citrate-citric acid, terbutaline   Assessment & Plan:  37 y.o. G2P0010 at 110w1d admitted for GHTN with superimposed Pre-e -Labor: Satisfactory labor  progress. -Fetal Well-being: Category II -GBS: negative -Membranes ruptured, clear fluid -Augmentation: IV Pitocin augmentation. -Analgesia: regional anesthesia   Chari Manning, CNM  07/03/2023 [facility]

## 2023-07-04 ENCOUNTER — Encounter: Payer: Self-pay | Admitting: Obstetrics and Gynecology

## 2023-07-04 LAB — CBC
HCT: 29.9 % — ABNORMAL LOW (ref 36.0–46.0)
HCT: 28.2 % — ABNORMAL LOW (ref 36.0–46.0)
HCT: 25.7 % — ABNORMAL LOW (ref 36.0–46.0)
Hemoglobin: 10.9 g/dL — ABNORMAL LOW (ref 12.0–15.0)
Hemoglobin: 10 g/dL — ABNORMAL LOW (ref 12.0–15.0)
Hemoglobin: 9.3 g/dL — ABNORMAL LOW (ref 12.0–15.0)
MCH: 32.2 pg (ref 26.0–34.0)
MCH: 32.5 pg (ref 26.0–34.0)
MCH: 32.7 pg (ref 26.0–34.0)
MCHC: 36.5 g/dL — ABNORMAL HIGH (ref 30.0–36.0)
MCHC: 35.5 g/dL (ref 30.0–36.0)
MCHC: 36.2 g/dL — ABNORMAL HIGH (ref 30.0–36.0)
MCV: 88.5 fL (ref 80.0–100.0)
MCV: 91.6 fL (ref 80.0–100.0)
MCV: 90.5 fL (ref 80.0–100.0)
Platelets: 205 10*3/uL (ref 150–400)
Platelets: 192 10*3/uL (ref 150–400)
Platelets: 184 10*3/uL (ref 150–400)
RBC: 3.38 MIL/uL — ABNORMAL LOW (ref 3.87–5.11)
RBC: 3.08 MIL/uL — ABNORMAL LOW (ref 3.87–5.11)
RBC: 2.84 MIL/uL — ABNORMAL LOW (ref 3.87–5.11)
RDW: 14.6 % (ref 11.5–15.5)
RDW: 14.8 % (ref 11.5–15.5)
RDW: 15 % (ref 11.5–15.5)
WBC: 21.8 10*3/uL — ABNORMAL HIGH (ref 4.0–10.5)
WBC: 21.3 10*3/uL — ABNORMAL HIGH (ref 4.0–10.5)
WBC: 15.2 10*3/uL — ABNORMAL HIGH (ref 4.0–10.5)
nRBC: 0 % (ref 0.0–0.2)
nRBC: 0 % (ref 0.0–0.2)
nRBC: 0 % (ref 0.0–0.2)

## 2023-07-04 LAB — COMPREHENSIVE METABOLIC PANEL
ALT: 21 U/L (ref 0–44)
AST: 33 U/L (ref 15–41)
Albumin: 2.3 g/dL — ABNORMAL LOW (ref 3.5–5.0)
Alkaline Phosphatase: 102 U/L (ref 38–126)
Anion gap: 10 (ref 5–15)
BUN: 12 mg/dL (ref 6–20)
CO2: 23 mmol/L (ref 22–32)
Calcium: 6 mg/dL — CL (ref 8.9–10.3)
Chloride: 98 mmol/L (ref 98–111)
Creatinine, Ser: 0.74 mg/dL (ref 0.44–1.00)
GFR, Estimated: >60 mL/min (ref >60)
Glucose, Bld: 127 mg/dL — ABNORMAL HIGH (ref 70–99)
Potassium: 2.7 mmol/L — CL (ref 3.5–5.1)
Sodium: 131 mmol/L — ABNORMAL LOW (ref 135–145)
Total Bilirubin: 0.6 mg/dL (ref 0.3–1.2)
Total Protein: 5 g/dL — ABNORMAL LOW (ref 6.5–8.1)

## 2023-07-04 LAB — MAGNESIUM: Magnesium: 6.4 mg/dL (ref 1.7–2.4)

## 2023-07-04 MED ORDER — PRENATAL MULTIVITAMIN CH
1.0000 | ORAL_TABLET | Freq: Every day | ORAL | Status: DC
  Administered 2023-07-04 – 2023-07-07 (×4): 1 via ORAL
  Filled 2023-07-04 (×4): qty 1

## 2023-07-04 MED ORDER — SENNOSIDES-DOCUSATE SODIUM 8.6-50 MG PO TABS
2.0000 | ORAL_TABLET | ORAL | Status: DC
  Administered 2023-07-04 – 2023-07-07 (×4): 2 via ORAL
  Filled 2023-07-04 (×4): qty 2

## 2023-07-04 MED ORDER — SODIUM CHLORIDE 0.9 % IV SOLN: 250.0000 mL | INTRAVENOUS | Status: DC | PRN

## 2023-07-04 MED ORDER — SODIUM CHLORIDE 0.9% FLUSH: 3.0000 mL | INTRAVENOUS | Status: DC | PRN

## 2023-07-04 MED ORDER — ACETAMINOPHEN 325 MG PO TABS
650.0000 mg | ORAL_TABLET | ORAL | Status: DC | PRN
  Administered 2023-07-04 – 2023-07-06 (×5): 650 mg via ORAL
  Filled 2023-07-04 (×6): qty 2

## 2023-07-04 MED ORDER — ONDANSETRON HCL 4 MG PO TABS: 4.0000 mg | ORAL_TABLET | ORAL | Status: DC | PRN

## 2023-07-04 MED ORDER — ZOLPIDEM TARTRATE 5 MG PO TABS: 5.0000 mg | ORAL_TABLET | Freq: Every evening | ORAL | Status: DC | PRN

## 2023-07-04 MED ORDER — GENTAMICIN SULFATE 40 MG/ML IJ SOLN
1.5000 mg/kg | Freq: Once | INTRAVENOUS | Status: AC
  Administered 2023-07-04: 140 mg via INTRAVENOUS
  Filled 2023-07-04: qty 3.5

## 2023-07-04 MED ORDER — ONDANSETRON HCL 4 MG/2ML IJ SOLN: 4.0000 mg | INTRAMUSCULAR | Status: DC | PRN

## 2023-07-04 MED ORDER — SODIUM CHLORIDE 0.9% FLUSH
3.0000 mL | Freq: Two times a day (BID) | INTRAVENOUS | Status: DC
  Administered 2023-07-05 – 2023-07-06 (×3): 3 mL via INTRAVENOUS

## 2023-07-04 MED ORDER — OXYCODONE HCL 5 MG PO TABS: 5.0000 mg | ORAL_TABLET | ORAL | Status: DC | PRN

## 2023-07-04 MED ORDER — FLEET ENEMA RE ENEM: 1.0000 | ENEMA | Freq: Every day | RECTAL | Status: DC | PRN

## 2023-07-04 MED ORDER — COCONUT OIL OIL
1.0000 | TOPICAL_OIL | Status: DC | PRN
  Administered 2023-07-05: 1 via TOPICAL
  Filled 2023-07-04: qty 7.5

## 2023-07-04 MED ORDER — TRANEXAMIC ACID-NACL 1000-0.7 MG/100ML-% IV SOLN
INTRAVENOUS | Status: AC
  Administered 2023-07-04: 1000 mg
  Filled 2023-07-04: qty 100

## 2023-07-04 MED ORDER — WITCH HAZEL-GLYCERIN EX PADS
1.0000 | MEDICATED_PAD | CUTANEOUS | Status: DC | PRN
  Administered 2023-07-06: 1 via TOPICAL
  Filled 2023-07-04: qty 100

## 2023-07-04 MED ORDER — BENZOCAINE-MENTHOL 20-0.5 % EX AERO
1.0000 | INHALATION_SPRAY | CUTANEOUS | Status: DC | PRN
  Administered 2023-07-04: 1 via TOPICAL
  Filled 2023-07-04: qty 56

## 2023-07-04 MED ORDER — NIFEDIPINE ER OSMOTIC RELEASE 30 MG PO TB24
30.0000 mg | ORAL_TABLET | Freq: Every day | ORAL | Status: DC
  Administered 2023-07-04: 30 mg via ORAL
  Filled 2023-07-04: qty 1

## 2023-07-04 MED ORDER — SODIUM CHLORIDE 0.9 % IV SOLN
1.0000 g | Freq: Once | INTRAVENOUS | Status: AC
  Administered 2023-07-04: 1 g via INTRAVENOUS
  Filled 2023-07-04: qty 1000

## 2023-07-04 MED ORDER — MISOPROSTOL 200 MCG PO TABS
800.0000 ug | ORAL_TABLET | Freq: Once | ORAL | Status: AC
  Administered 2023-07-04: 800 ug via RECTAL

## 2023-07-04 MED ORDER — DIBUCAINE (PERIANAL) 1 % EX OINT
1.0000 | TOPICAL_OINTMENT | CUTANEOUS | Status: DC | PRN
  Administered 2023-07-06: 1 via RECTAL
  Filled 2023-07-04: qty 28

## 2023-07-04 MED ORDER — BISACODYL 10 MG RE SUPP: 10.0000 mg | Freq: Every day | RECTAL | Status: DC | PRN

## 2023-07-04 MED ORDER — DIPHENHYDRAMINE HCL 25 MG PO CAPS: 25.0000 mg | ORAL_CAPSULE | Freq: Four times a day (QID) | ORAL | Status: DC | PRN

## 2023-07-04 MED ORDER — IBUPROFEN 600 MG PO TABS
600.0000 mg | ORAL_TABLET | Freq: Four times a day (QID) | ORAL | Status: DC
  Administered 2023-07-04 – 2023-07-07 (×13): 600 mg via ORAL
  Filled 2023-07-04 (×13): qty 1

## 2023-07-04 MED ORDER — SIMETHICONE 80 MG PO CHEW: 80.0000 mg | CHEWABLE_TABLET | ORAL | Status: DC | PRN

## 2023-07-04 MED ORDER — POTASSIUM CHLORIDE 10 MEQ/100ML IV SOLN
10.0000 meq | INTRAVENOUS | Status: AC
  Administered 2023-07-04 – 2023-07-05 (×4): 10 meq via INTRAVENOUS
  Filled 2023-07-04 (×4): qty 100

## 2023-07-04 NOTE — Progress Notes (Signed)
Postpartum Day  0  Subjective: 37 y.o. G2P1011 postpartum day #0 status post normal spontaneous vaginal delivery.  She states she feels fatigue. Her pain is well controlled on PO pain medications. Her lochia is less than menses. Denies headaches, visual changes, and RUQ pain.  Objective: BP 127/77 (BP Location: Right Arm)   Pulse 94   Temp 98.5 F (36.9 C) (Axillary)   Resp 16   Ht 5\' 6"  (1.676 m)   Wt 93.4 kg   SpO2 96%   Breastfeeding Unknown   BMI 33.25 kg/m   Intake/Output Summary (Last 24 hours) at 07/04/2023 1305 Last data filed at 07/04/2023 1203 Gross per 24 hour  Intake 3999.48 ml  Output 2300 ml  Net 1699.48 ml    Vitals:   07/04/23 0400 07/04/23 0500 07/04/23 0514 07/04/23 0530  BP: (!) 142/92 (!) 143/79 (!) 145/85 (!) 151/72   07/04/23 0544 07/04/23 0600 07/04/23 0700 07/04/23 0800  BP: (!) 150/90 121/65 (!) 154/92 136/88   07/04/23 0859 07/04/23 0959 07/04/23 1100 07/04/23 1200  BP: (!) 133/92 (!) 140/92 111/81 127/77    Physical Exam:  General: alert, cooperative, appears stated age, fatigued, no distress, and pale Breasts: soft/nontender Pulm: nl effort Abdomen: soft, non-tender Uterine Fundus: firm Perineum: minimal edema, repair well approximated Lochia: appropriate DVT Evaluation: Mild significant calf/ankle edema.  Recent Labs    07/04/23 0415 07/04/23 0751  HGB 10.9* 10.0*  HCT 29.9* 28.2*  WBC 21.8* 21.3*  PLT 205 192    Assessment/Plan: 37 y.o. G2P1011 postpartumtum day # 0  1. Continue routine postpartum care  2. Acute blood loss anemia - clinically significant.  -Hemodynamically stable and asymptomatic -Intervention: continue on oral supplementation with ferrous sulfate 325  3. Immunization status:   all immunizations up to date  4. Patient has preeclampsia and is currently receiving IV magnesium sulfate post delivery. No      signs of toxicity and has adequate urine output over the past 4 hours.   5. Received antibiotics  (Ampicillin and Gentamicin) for prophylaxis post delivery due to manual      sweep of uterus (small pieces of placenta manually removed). Afebrile post      delivery. No signs of infection at this time.  6. Kayla Garnet, MD updated on patient status  Disposition: continue inpatient postpartum care    LOS: 2 days   Kayla Townsend, CNM 07/04/2023, 1:00 PM   ----- Kayla Townsend Certified Nurse Midwife [facility]

## 2023-07-04 NOTE — Lactation Note (Signed)
This note was copied from a baby's chart. Lactation Consultation Note  Patient Name: Kayla Townsend Today's Date: 07/04/2023 Age:36 hours Reason for consult: Initial assessment;Primapara;Early term 37-38.6wks;RN request;Mother's request   Maternal Data This is mom's 1st baby, SVD. Mom with history of anxiety, AMA, GHTN, preeclampsia with severe features.  On initial visit met mom in L&D as mom is receiving mag for preeclampsia. Mom reports baby has been latching and breastfeeding. Does bettter on one breast with some difficulty on the 2nd breast.  Has patient been taught Hand Expression?:  (Mom resting on her side in bed. Discussed hand expression but did not observe mom hand expressing.) Does the patient have breastfeeding experience prior to this delivery?: No  Feeding Mother's Current Feeding Choice: Breast Milk Baby asleep and completed a feed not long before LC visit. Provided mom with some tips and strategies to maximize position and latch technique. Recommended since baby latches well in the cross cradle at the one breast consider positioning the baby in football hold at the breast baby is having some difficulty with.   Interventions Interventions: Breast feeding basics reviewed;Hand express;Education (Discussed potential for inconsistent breastfeeding related to early term birth. Baby has been latching and breastfeeding with normal blood sugar results. Discussed with mom to continue offering baby to breast with feeding cues at least 8 times/24 hrs.)Discussed with mom if she is having latch issues tonight to call care nurse tonight and in am request assistance from [facility]  Discharge Pump: Personal  Consult Status Consult Status: Follow-up from L&D Date: 07/05/23 Follow-up type: In-patient  Update provided to care nurse  Fuller Song 07/04/2023, 7:51 PM

## 2023-07-04 NOTE — Discharge Summary (Signed)
Postpartum Discharge Summary  Patient Name: Kayla Townsend DOB: [birth year] MRN: [id number]  Date of admission: 07/02/2023 Delivery date:07/04/2023 Delivering provider: Chari Manning Date of discharge: 07/07/2023  Primary OB: [facility] LMP:No LMP recorded. EDC Estimated Date of Delivery: 07/23/23 Gestational Age at Delivery: [redacted]w[redacted]d   Admitting diagnosis: Elevated blood pressure affecting pregnancy in third trimester, antepartum [O16.3] Pre-eclampsia [O14.90] Intrauterine pregnancy: [redacted]w[redacted]d     Secondary diagnosis:   Principal Problem:   Elevated blood pressure affecting pregnancy in third trimester, antepartum Active Problems:   Pre-eclampsia   Discharge Diagnosis: Term Pregnancy Delivered      Hospital course: Induction of Labor With Vaginal Delivery   37 y.o. yo G2P1011 at [redacted]w[redacted]d was admitted to the hospital 07/02/2023 for induction of labor.  Indication for induction: Gestational hypertension and Preeclampsia.  Patient had an labor course complicated by pre-e with severe features Membrane Rupture Time/Date: 4:44 PM,07/03/2023  Delivery Method:Vaginal, Spontaneous Operative Delivery:N/A Episiotomy: None Lacerations:  Labial Details of delivery can be found in separate delivery note.  Patient had a postpartum course complicated by preeclampsia with severe features and acute blood loss anemia.  She received magnesium sulfate infusion postpartum. Venofer transfusion x 1 given. She was started on oral antihypertensives and Lasix for 5 days. Patient is discharged home 07/07/23.  Newborn Data: Birth date:07/04/2023 Birth time:12:57 AM Gender:Female Living status:Living Apgars:8 ,9  Weight:3150 g                                            Post partum procedures: mag sulfate and venofer infusion  Induction:: AROM, Pitocin, Cytotec, and IP Foley Complications: None Delivery Type: spontaneous vaginal delivery Anesthesia: epidural anesthesia Placenta:  spontaneous, manual removal fragments Pathology: Yes   Prenatal Labs:  Blood type/Rh A POS   Antibody screen Negative    Rubella Immune    Varicella Immune  RPR Neg    HBsAg Neg   Hep C NR   HIV Neg    GC neg  Chlamydia neg  Genetic screening declined  1 hour GTT 120  3 hour GTT N/a  GBS neg      Magnesium Sulfate received: Yes: Seizure prophylaxis BMZ received: No Rhophylac:was not indicated MMR: was not indicated Varivax vaccine given: was not indicated T-DaP:Given prenatally Flu: Given prenatally  Transfusion:No  Physical exam  Vitals:   07/07/23 0048 07/07/23 0305 07/07/23 0347 07/07/23 0712  BP: 133/86 (!) 83/47 128/80 (!) 135/97  Pulse: 93 68 94 (!) 107  Resp: 18 18 18 18   Temp:  97.9 F (36.6 C) 98.3 F (36.8 C) 98.5 F (36.9 C)  TempSrc:  Oral Oral Oral  SpO2: 99% 100% 98% 100%  Weight:      Height:       General: alert, cooperative, and no distress Lochia: appropriate Uterine Fundus: firm Perineum:minimal edema/repair well approximated DVT Evaluation: No evidence of DVT seen on physical exam.  Labs: Lab Results  Component Value Date   WBC 8.6 07/06/2023   HGB 9.3 (L) 07/06/2023   HCT 26.4 (L) 07/06/2023   MCV 92.6 07/06/2023   PLT 170 07/06/2023      Latest Ref Rng & Units 07/06/2023    8:23 AM  CMP  Glucose 70 - 99 mg/dL 81   BUN 6 - 20 mg/dL 9   Creatinine 4.09 - 8.11 mg/dL 9.14   Sodium 782 - 956 mmol/L  136   Potassium 3.5 - 5.1 mmol/L 3.4   Chloride 98 - 111 mmol/L 107   CO2 22 - 32 mmol/L 23   Calcium 8.9 - 10.3 mg/dL 7.4    Edinburgh Score:    07/07/2023    3:50 AM  Edinburgh Postnatal Depression Scale Screening Tool  I have been able to laugh and see the funny side of things. 0  I have looked forward with enjoyment to things. 0  I have blamed myself unnecessarily when things went wrong. 0  I have been anxious or worried for no good reason. 0  I have felt scared or panicky for no good reason. 0  Things have been getting on  top of me. 1  I have been so unhappy that I have had difficulty sleeping. 0  I have felt sad or miserable. 1  I have been so unhappy that I have been crying. 1  The thought of harming myself has occurred to me. 0  Edinburgh Postnatal Depression Scale Total 3    Risk assessment for postpartum VTE and prophylactic treatment: Very high risk factors: None High risk factors: None Moderate risk factors: Preeclampsia  and BMI 30-40 kg/m2  Postpartum VTE prophylaxis with LMWH not indicated  After visit meds:  Allergies as of 07/07/2023       Reactions   Ceclor [cefaclor] Swelling   As a child        Medication List     STOP taking these medications    aspirin EC 81 MG tablet   ondansetron 4 MG tablet Commonly known as: ZOFRAN   potassium chloride 10 MEQ tablet Commonly known as: KLOR-CON M       TAKE these medications    acetaminophen 325 MG tablet Commonly known as: Tylenol Take 3 tablets (975 mg total) by mouth every 6 (six) hours as needed for mild pain, moderate pain or fever (for pain scale < 4).   ferrous sulfate 325 (65 FE) MG EC tablet Take 1 tablet (325 mg total) by mouth daily with breakfast.   furosemide 20 MG tablet Commonly known as: LASIX Take 1 tablet (20 mg total) by mouth daily for 3 days.   ibuprofen 600 MG tablet Commonly known as: ADVIL Take 1 tablet (600 mg total) by mouth every 6 (six) hours as needed for mild pain or cramping.   labetalol 200 MG tablet Commonly known as: NORMODYNE Take 1 tablet (200 mg total) by mouth 2 (two) times daily.   NIFEdipine 60 MG 24 hr tablet Commonly known as: ADALAT CC Take 1 tablet (60 mg total) by mouth daily.   prenatal multivitamin Tabs tablet Take 1 tablet by mouth daily at 12 noon.   senna-docusate 8.6-50 MG tablet Commonly known as: Senokot-S Take 2 tablets by mouth at bedtime as needed for mild constipation.       Discharge home in stable condition Infant Feeding: Breast Infant  Disposition:home with mother Discharge instruction: per After Visit Summary and Postpartum booklet. Activity: Advance as tolerated. Pelvic rest for 6 weeks.  Diet: routine diet Anticipated Birth Control: Unsure Postpartum Appointment:6 weeks Additional Postpartum F/U: Postpartum Depression checkup Future Appointments: Future Appointments  Date Time Provider Department Center  06/08/2024  1:00 PM Marjie Skiff, NP [facility]   Follow up Visit:  Follow-up Information     Chari Manning, CNM Follow up in 2 week(s).   Specialty: Obstetrics Why: mood check Contact information: [address] [place] [address] [phone]  Chari Manning, CNM Follow up in 6 week(s).   Specialty: Obstetrics Why: postpartum visit Contact information: [address] [place] [address] [phone]         [facility] Follow up on 07/10/2023.   Why: blood pressure check Contact information: [address] [place] [address] [phone]                Plan:  Retta Pitcher was discharged to home in good condition. Follow-up appointment as directed.    Signed:  Margaretmary Eddy, CNM Certified Nurse Midwife [facility]

## 2023-07-04 NOTE — Progress Notes (Signed)
Postpartum Day  0  Subjective: 37 y.o. G2P1011 postpartum day #1 status post normal spontaneous vaginal delivery. She is not ambulating, is not voiding spontaneously.Foley catheter remains in place  Objective: BP (!) 148/82   Pulse 92   Temp 98.8 F (37.1 C) (Oral)   Resp 18   Ht 5\' 6"  (1.676 m)   Wt 93.4 kg   SpO2 95%   Breastfeeding Unknown   BMI 33.25 kg/m    Physical Exam:  General: alert, cooperative, and appears stated age Breasts: soft/nontender Pulm: nl effort Abdomen: soft, non-tender, active bowel sounds Uterine Fundus: firm Lochia: appropriate   Recent Labs    07/04/23 0415 07/04/23 0751  HGB 10.9* 10.0*  HCT 29.9* 28.2*  WBC 21.8* 21.3*  PLT 205 192    Assessment/Plan: 36 y.o. G2P1011 postpartum day # 0  1. Continue routine postpartum care  2. Infant feeding status: breast feeding   3. Preeclampsia -Continue Magnesium 2g/hr - monitor I'O's -Seizure precautions -continue to monitor labs  4. Acute blood loss anemia - clinically significant.  --Hemodynamically stable and asymptomatic --Intervention: start on oral supplementation with ferrous sulfate 325 mg   5. Uterine Sweep:Start Ampicillin x1 dose and Gentamicin x 1 dose -monitor bleeding   Disposition: continue inpatient postpartum care    LOS: 2 days     ----- Chari Manning Certified Nurse Midwife [facility]

## 2023-07-05 LAB — COMPREHENSIVE METABOLIC PANEL
ALT: 21 U/L (ref 0–44)
AST: 31 U/L (ref 15–41)
Albumin: 2.3 g/dL — ABNORMAL LOW (ref 3.5–5.0)
Alkaline Phosphatase: 82 U/L (ref 38–126)
Anion gap: 6 (ref 5–15)
BUN: 10 mg/dL (ref 6–20)
CO2: 23 mmol/L (ref 22–32)
Calcium: 5.8 mg/dL — CL (ref 8.9–10.3)
Chloride: 107 mmol/L (ref 98–111)
Creatinine, Ser: 0.65 mg/dL (ref 0.44–1.00)
GFR, Estimated: >60 mL/min (ref >60)
Glucose, Bld: 90 mg/dL (ref 70–99)
Potassium: 3.4 mmol/L — ABNORMAL LOW (ref 3.5–5.1)
Sodium: 136 mmol/L (ref 135–145)
Total Bilirubin: 0.4 mg/dL (ref 0.3–1.2)
Total Protein: 5.1 g/dL — ABNORMAL LOW (ref 6.5–8.1)

## 2023-07-05 LAB — CBC
HCT: 24.6 % — ABNORMAL LOW (ref 36.0–46.0)
HCT: 24.8 % — ABNORMAL LOW (ref 36.0–46.0)
Hemoglobin: 8.8 g/dL — ABNORMAL LOW (ref 12.0–15.0)
Hemoglobin: 8.9 g/dL — ABNORMAL LOW (ref 12.0–15.0)
MCH: 32.4 pg (ref 26.0–34.0)
MCH: 32.7 pg (ref 26.0–34.0)
MCHC: 35.8 g/dL (ref 30.0–36.0)
MCHC: 35.9 g/dL (ref 30.0–36.0)
MCV: 90.2 fL (ref 80.0–100.0)
MCV: 91.4 fL (ref 80.0–100.0)
Platelets: 151 10*3/uL (ref 150–400)
Platelets: 163 10*3/uL (ref 150–400)
RBC: 2.69 MIL/uL — ABNORMAL LOW (ref 3.87–5.11)
RBC: 2.75 MIL/uL — ABNORMAL LOW (ref 3.87–5.11)
RDW: 14.9 % (ref 11.5–15.5)
RDW: 15.2 % (ref 11.5–15.5)
WBC: 11.5 10*3/uL — ABNORMAL HIGH (ref 4.0–10.5)
WBC: 8.7 10*3/uL (ref 4.0–10.5)
nRBC: 0 % (ref 0.0–0.2)
nRBC: 0 % (ref 0.0–0.2)

## 2023-07-05 LAB — SURGICAL PATHOLOGY

## 2023-07-05 MED ORDER — SODIUM CHLORIDE 0.9 % IV SOLN
300.0000 mg | Freq: Once | INTRAVENOUS | Status: AC
  Administered 2023-07-05: 300 mg via INTRAVENOUS
  Filled 2023-07-05: qty 15

## 2023-07-05 MED ORDER — NIFEDIPINE ER OSMOTIC RELEASE 30 MG PO TB24
60.0000 mg | ORAL_TABLET | Freq: Every day | ORAL | Status: DC
  Administered 2023-07-05: 60 mg via ORAL
  Filled 2023-07-05: qty 2

## 2023-07-05 NOTE — Lactation Note (Signed)
This note was copied from a baby's chart. Lactation Consultation Note  Patient Name: Kayla Townsend [id number] Date: 07/05/2023 Age:37 hours Reason for consult: Follow-up assessment;Mother's request;Primapara;Early term 37-38.6wks;Breastfeeding assistance   Maternal Data Lactation to room for assistance w/ a breastfeeding session.  Infant is currently under bili lights but is being taken out for feedings.  Feeding Mother's Current Feeding Choice: Breast Milk  LC assisted patient with putting infant to the left breast in football hold.  Support pillows were provided and LC assisted patient with her hand position on infant.  Infant opens mouth wide but bottom lip turns in.  Able to flange lip out by just pulling down jaw.  Infant actively fed and latched without any issues.  Infant did switch breast and fed off of the rt breast in modified cradle hold for about 10 minutes.  Active feeding with audible swallows.  Patient feeling more confident in how to bring baby to breast and positioning.    Patient will need more practice with football hold hand position.    LATCH Score Latch: Grasps breast easily, tongue down, lips flanged, rhythmical sucking.  Audible Swallowing: Spontaneous and intermittent  Type of Nipple: Everted at rest and after stimulation  Comfort (Breast/Nipple): Soft / non-tender  Hold (Positioning): No assistance needed to correctly position infant at breast.  LATCH Score: 10  Interventions Interventions: Assisted with latch;Skin to skin;Breast massage;Hand express;Breast compression;Adjust position;Support pillows;Position options;Education  LC discussed the importance of lactation support once home and reaching out to [facility] outpatient lactation.  Patient will need to continue to practice with breastfeeding.  Patients positioning looks good and infant latches well to the breast.  Consult Status Consult Status: Follow-up Follow-up type: In-patient    [name]  Free 07/05/2023, 5:15 PM

## 2023-07-05 NOTE — Progress Notes (Signed)
Mag discontinued at 0030 10/4. Will continue to monitor for 1hr before transfer to postpartum

## 2023-07-05 NOTE — Progress Notes (Signed)
Post Partum Day 1  Subjective: Doing well, no concerns. Ambulating without difficulty, pain managed with PO meds, tolerating regular diet, and voiding without difficulty.   No fever/chills, chest pain, shortness of breath, nausea/vomiting, or leg pain. No nipple or breast pain. No headache, visual changes, or RUQ/epigastric pain.  Objective: BP (!) 136/96 (BP Location: Left Arm) Comment: nurse Raynelle Fanning notified  Pulse 99   Temp 98.6 F (37 C) (Oral)   Resp 17   Ht 5\' 6"  (1.676 m)   Wt 93.4 kg   SpO2 97% Comment: Room Air  Breastfeeding Unknown   BMI 33.25 kg/m    Physical Exam:  General: alert and cooperative Breasts: soft/nontender CV: RRR Pulm: nl effort Abdomen: soft, non-tender Uterine Fundus: firm Incision: n/a Perineum: minimal edema, repair well approximated Lochia: appropriate DVT Evaluation: No evidence of DVT seen on physical exam. Edinburgh:     06/13/2022    1:44 PM  Inocente Salles Postnatal Depression Scale Screening Tool  I have been able to laugh and see the funny side of things. 0  I have looked forward with enjoyment to things. 0  I have blamed myself unnecessarily when things went wrong. 0  I have been anxious or worried for no good reason. 0  I have felt scared or panicky for no good reason. 0  Things have been getting on top of me. 0  I have been so unhappy that I have had difficulty sleeping. 0  I have felt sad or miserable. 0  I have been so unhappy that I have been crying. 0  The thought of harming myself has occurred to me. 0  Edinburgh Postnatal Depression Scale Total 0     Recent Labs    07/04/23 2355 07/05/23 0539  HGB 8.8* 8.9*  HCT 24.6* 24.8*  WBC 11.5* 8.7  PLT 163 151    Assessment/Plan: 36 y.o. G2P1011 postpartum day # 1  1. Continue routine postpartum care  2. Infant feeding status: breast feeding -Lactation consult PRN for breastfeeding   3. Contraception plan:  TBD  4. Acute blood loss anemia - clinically significant.   -Hemodynamically stable and asymptomatic -Intervention: continue on oral supplementation with ferrous sulfate 325 and IV iron transfusion with venofer given   5. Immunization status:   all immunizations up to date  6. Pre-E with severe features - 24hr mag completed - Procardia 60mg  every day - will monitor BPs today to see if she needs a higher dose - Labs stable  Disposition: Continue inpatient postpartum care    LOS: 3 days   Kayla Townsend, CNM 07/05/2023, 9:41 AM

## 2023-07-05 NOTE — Lactation Note (Signed)
This note was copied from a baby's chart. Lactation Consultation Note  Patient Name: Kayla Townsend [id number] Date: 07/05/2023 Age:36 hours Reason for consult: Follow-up assessment;Primapara;Early term 37-38.6wks   Maternal Data Does the patient have breastfeeding experience prior to this delivery?: No  Mom completing a nursing session upon entry into room.  Patient stated infant had been on the breast for 10 minutes.   Feeding Mother's Current Feeding Choice: Breast Milk  LC would like to observe a feeding at the breast to assist family with positions and check in on latch and feeding. Interventions Interventions: Breast feeding basics reviewed;Skin to skin;Breast massage;Position options;Education;CDC Guidelines for Breast Pump Cleaning  LC taught patient how to hand express.  Patient was able to express out a drop of milk from breast.  Discussed trying the football position for a feeding.  STS benefits were reviewed with family and encouraged.   Discharge Discharge Education: Engorgement and breast care;Outpatient recommendation  Education on engorgement prevention/treatment was discussed as well as breastmilk storage guidelines.  LC provided patient with milk storage guidelines from CDC. LC also discussed how to contact [facility] outpatient lactation services for support. Patient verbalized understanding.    Consult Status Consult Status: Follow-up Follow-up type: In-patient    [name] Free 07/05/2023, 12:37 PM

## 2023-07-05 NOTE — Anesthesia Postprocedure Evaluation (Signed)
Anesthesia Post Note  Patient: Kayla Townsend  Procedure(s) Performed: AN AD HOC LABOR EPIDURAL  Patient location during evaluation: Mother Baby Anesthesia Type: Epidural Level of consciousness: awake and alert and oriented Pain management: pain level controlled Vital Signs Assessment: post-procedure vital signs reviewed and stable Respiratory status: respiratory function stable Cardiovascular status: stable Postop Assessment: no headache, no backache, patient able to bend at knees, no apparent nausea or vomiting, able to ambulate and adequate PO intake Anesthetic complications: no   No notable events documented.   Last Vitals:  Vitals:   07/05/23 0725 07/05/23 0820  BP: (!) 151/90 (!) 136/96  Pulse: (!) 105 99  Resp: 16 17  Temp: 36.7 C 37 C  SpO2:  97%    Last Pain:  Vitals:   07/05/23 0907  TempSrc:   PainSc: 3                  Zachary George

## 2023-07-06 LAB — CBC
HCT: 26.4 % — ABNORMAL LOW (ref 36.0–46.0)
Hemoglobin: 9.3 g/dL — ABNORMAL LOW (ref 12.0–15.0)
MCH: 32.6 pg (ref 26.0–34.0)
MCHC: 35.2 g/dL (ref 30.0–36.0)
MCV: 92.6 fL (ref 80.0–100.0)
Platelets: 170 10*3/uL (ref 150–400)
RBC: 2.85 MIL/uL — ABNORMAL LOW (ref 3.87–5.11)
RDW: 15 % (ref 11.5–15.5)
WBC: 8.6 10*3/uL (ref 4.0–10.5)
nRBC: 0.2 % (ref 0.0–0.2)

## 2023-07-06 LAB — BASIC METABOLIC PANEL
Anion gap: 6 (ref 5–15)
BUN: 9 mg/dL (ref 6–20)
CO2: 23 mmol/L (ref 22–32)
Calcium: 7.4 mg/dL — ABNORMAL LOW (ref 8.9–10.3)
Chloride: 107 mmol/L (ref 98–111)
Creatinine, Ser: 0.59 mg/dL (ref 0.44–1.00)
GFR, Estimated: >60 mL/min (ref >60)
Glucose, Bld: 81 mg/dL (ref 70–99)
Potassium: 3.4 mmol/L — ABNORMAL LOW (ref 3.5–5.1)
Sodium: 136 mmol/L (ref 135–145)

## 2023-07-06 MED ORDER — LABETALOL HCL 200 MG PO TABS
200.0000 mg | ORAL_TABLET | Freq: Two times a day (BID) | ORAL | Status: DC
  Administered 2023-07-07: 200 mg via ORAL
  Filled 2023-07-06: qty 1

## 2023-07-06 MED ORDER — NIFEDIPINE ER OSMOTIC RELEASE 30 MG PO TB24
90.0000 mg | ORAL_TABLET | Freq: Every day | ORAL | Status: DC
  Administered 2023-07-06: 90 mg via ORAL
  Filled 2023-07-06: qty 3

## 2023-07-06 MED ORDER — GUAIFENESIN ER 600 MG PO TB12
600.0000 mg | ORAL_TABLET | Freq: Two times a day (BID) | ORAL | Status: DC
  Administered 2023-07-06 – 2023-07-07 (×2): 600 mg via ORAL
  Filled 2023-07-06 (×2): qty 1

## 2023-07-06 MED ORDER — FUROSEMIDE 20 MG PO TABS
20.0000 mg | ORAL_TABLET | Freq: Every day | ORAL | Status: DC
  Administered 2023-07-06 – 2023-07-07 (×2): 20 mg via ORAL
  Filled 2023-07-06 (×2): qty 1

## 2023-07-06 MED ORDER — LABETALOL HCL 200 MG PO TABS
200.0000 mg | ORAL_TABLET | ORAL | Status: AC
  Administered 2023-07-06: 200 mg via ORAL
  Filled 2023-07-06: qty 2

## 2023-07-06 NOTE — Progress Notes (Signed)
Postpartum Day  2  Subjective: 37 y.o. G2P1011 postpartum day #2 status post normal spontaneous vaginal delivery. Notified by RN for severe range blood pressure with repeat in mild range. She's received Procardia 90mg  XL daily and lasix 20 mg PO. She denies HA, visual changes, or RUQ pain.    Objective: BP (!) 130/91 (BP Location: Left Arm)   Pulse 100   Temp 99.2 F (37.3 C) (Axillary)   Resp 16   Ht 5\' 6"  (1.676 m)   Wt 93.4 kg   SpO2 99%   Breastfeeding Unknown   BMI 33.25 kg/m   Vitals:   07/05/23 0820 07/05/23 1205 07/05/23 1640 07/05/23 1924  BP: (!) 136/96 139/80 (!) 146/97 138/89   07/06/23 0012 07/06/23 0301 07/06/23 0759 07/06/23 1214  BP: (!) 139/91 (!) 144/90 (!) 156/94 (!) 152/104   07/06/23 1643 07/06/23 1652 07/06/23 1713 07/06/23 1851  BP: (!) 158/101 (!) 158/114 (!) 159/108 (!) 130/91     Physical Exam:  Deferred   Recent Labs    07/05/23 0539 07/06/23 0823  HGB 8.9* 9.3*  HCT 24.8* 26.4*  WBC 8.7 8.6  PLT 151 170    Assessment/Plan: 36 y.o. G2P1011 postpartum day # 1  Preeclampsia  -Blood pressure steadily increasing throughout the day despite Procardia and Lasix  -Severe range blood pressure x 1 (158/114) followed by mild, though borderline severe range blood pressure (159/108) -Stat PO Labetalol 200 mg given  -Follow up blood pressure 1 hour later was 130/91 -She remains asymptomatic  -Will add in Labetalol 200 mg BID to regimen  -Plan to follow BP's overnight  -Can consider discharge tomorrow if Blood pressures remain stable   Disposition: continue inpatient postpartum care , plan for discharge home tomorrow    LOS: 4 days   Gustavo Lah, CNM 07/06/2023, 7:11 PM   ----- Margaretmary Eddy  Certified Nurse Midwife [facility]

## 2023-07-06 NOTE — Discharge Instructions (Signed)
For blood pressure monitoring at home: Please check your blood pressure at home daily and write down what it is so that you can review it with your provider  Make sure you are able to sit for 15 minutes prior to taking it  If blood pressure 160/110 or greater repeat in 15 minutes.  If still 160/110 or greater come to the Emergency Department for evaluation.  If it goes down please contact the on call provider to check in.   Please go to the ED for severe range blood pressures (160/110 or higher), moderate to severe headache that is not relieved with Tylenol, chest pain, shortness of breath, changes in vision, or persistent pain in upper right abdomen.  Your medication may cause a mild headache or dizziness as you get used to it.  Please make sure that you are drinking plenty of water to help with this side effect.  Drinking 8-12 ounces of water with you medication, if you have a headache, and staying hydrated throughout the day can often help with side effects.   Vaginal Delivery, Care After Refer to this sheet in the next few weeks. These discharge instructions provide you with information on caring for yourself after delivery. Your caregiver may also give you specific instructions. Your treatment has been planned according to the most current medical practices available, but problems sometimes occur. Call your caregiver if you have any problems or questions after you go home. HOME CARE INSTRUCTIONS Take over-the-counter or prescription medicines only as directed by your caregiver or pharmacist. Do not drink alcohol, especially if you are breastfeeding or taking medicine to relieve pain. Do not smoke tobacco. Continue to use good perineal care. Good perineal care includes: Wiping your perineum from back to front Keeping your perineum clean. You can do sitz baths twice a day, to help keep this area clean Do not use tampons, douche or have sex until your caregiver says it is okay. Shower only and  avoid sitting in submerged water, aside from sitz baths Wear a well-fitting bra that provides breast support. Eat healthy foods. Drink enough fluids to keep your urine clear or pale yellow. Eat high-fiber foods such as whole grain cereals and breads, brown rice, beans, and fresh fruits and vegetables every day. These foods may help prevent or relieve constipation. Avoid constipation with high fiber foods or medications, such as miralax or metamucil Follow your caregiver's recommendations regarding resumption of activities such as climbing stairs, driving, lifting, exercising, or traveling. Talk to your caregiver about resuming sexual activities. Resumption of sexual activities is dependent upon your risk of infection, your rate of healing, and your comfort and desire to resume sexual activity. Try to have someone help you with your household activities and your newborn for at least a few days after you leave the hospital. Rest as much as possible. Try to rest or take a nap when your newborn is sleeping. Increase your activities gradually. Keep all of your scheduled postpartum appointments. It is very important to keep your scheduled follow-up appointments. At these appointments, your caregiver will be checking to make sure that you are healing physically and emotionally. SEEK MEDICAL CARE IF:  You are passing large clots from your vagina. Save any clots to show your caregiver. You have a foul smelling discharge from your vagina. You have trouble urinating. You are urinating frequently. You have pain when you urinate. You have a change in your bowel movements. You have increasing redness, pain, or swelling near your vaginal incision (  episiotomy) or vaginal tear. You have pus draining from your episiotomy or vaginal tear. Your episiotomy or vaginal tear is separating. You have painful, hard, or reddened breasts. You have a severe headache. You have blurred vision or see spots. You feel sad or  depressed. You have thoughts of hurting yourself or your newborn. You have questions about your care, the care of your newborn, or medicines. You are dizzy or light-headed. You have a rash. You have nausea or vomiting. You were breastfeeding and have not had a menstrual period within 12 weeks after you stopped breastfeeding. You are not breastfeeding and have not had a menstrual period by the 12th week after delivery. You have a fever. SEEK IMMEDIATE MEDICAL CARE IF:  You have persistent pain. You have chest pain. You have shortness of breath. You faint. You have leg pain. You have stomach pain. Your vaginal bleeding saturates two or more sanitary pads in 1 hour. MAKE SURE YOU:  Understand these instructions. Will watch your condition. Will get help right away if you are not doing well or get worse. Document Released: 09/14/2000 Document Revised: 02/01/2014 Document Reviewed: 05/14/2012 ExitCare Patient Information 2015 [employer]. This information is not intended to replace advice given to you by your health care provider. Make sure you discuss any questions you have with your health care provider.  Sitz Bath A sitz bath is a warm water bath taken in the sitting position. The water covers only the hips and butt (buttocks). We recommend using one that fits in the toilet, to help with ease of use and cleanliness. It may be used for either healing or cleaning purposes. Sitz baths are also used to relieve pain, itching, or muscle tightening (spasms). The water may contain medicine. Moist heat will help you heal and relax.  HOME CARE  Take 3 to 4 sitz baths a day. Fill the bathtub half-full with warm water. Sit in the water and open the drain a little. Turn on the warm water to keep the tub half-full. Keep the water running constantly. Soak in the water for 15 to 20 minutes. After the sitz bath, pat the affected area dry. GET HELP RIGHT AWAY IF: You get worse instead of better.  Stop the sitz baths if you get worse. MAKE SURE YOU: Understand these instructions. Will watch your condition. Will get help right away if you are not doing well or get worse. Document Released: 10/25/2004 Document Revised: 06/11/2012 Document Reviewed: 01/15/2011 ExitCare Patient Information 2015 [employer]. This information is not intended to replace advice given to you by your health care provider. Make sure you discuss any questions you have with your health care provider.   

## 2023-07-06 NOTE — Lactation Note (Addendum)
This note was copied from a baby's chart. Lactation Consultation Note  Patient Name: Kayla Townsend [id number] Date: 07/06/2023 Age:36 hours Reason for consult: Follow-up assessment;Primapara;Early term 37-38.6wks;Hyperbilirubinemia   Maternal Data Has patient been taught Hand Expression?: Yes  Feeding Mother's Current Feeding Choice: Breast Milk Mom breastfeeding when room entered, minor assistance with latch to right breast but mom and baby do well, audible swallows noted, mom encouraged  LATCH Score Latch: Grasps breast easily, tongue down, lips flanged, rhythmical sucking.  Audible Swallowing: Spontaneous and intermittent  Type of Nipple: Everted at rest and after stimulation  Comfort (Breast/Nipple): Filling, red/small blisters or bruises, mild/mod discomfort  Hold (Positioning): No assistance needed to correctly position infant at breast.  LATCH Score: 9   Lactation Tools Discussed/Used  LC name and no updated on white board  Interventions Interventions: Breast feeding basics reviewed;Assisted with latch;Hand express;Adjust position;Support pillows;Education  Discharge Discharge Education: Engorgement and breast care Pump: Personal WIC Program: No  Consult Status Consult Status: PRN Date: 07/06/23 Follow-up type: In-patient    Dyann Kief 07/06/2023, 11:41 AM

## 2023-07-06 NOTE — Lactation Note (Signed)
This note was copied from a baby's chart. Lactation Consultation Note  Patient Name: Kayla Townsend [id number] Date: 07/06/2023 Age:36 hours Reason for consult: Follow-up assessment;Primapara;Early term 37-38.6wks;Hyperbilirubinemia   Maternal Data    Feeding Mother's Current Feeding Choice: Breast Milk MOM breast feeding baby on right breast in cradle hold, encouraged support of breast and pillow support under baby and having baby tummy to tummy, offer breast frequently as bili increasing slightly.  LATCH Score Latch: Grasps breast easily, tongue down, lips flanged, rhythmical sucking.  Audible Swallowing: Spontaneous and intermittent (needs stimulation at times)  Type of Nipple: Everted at rest and after stimulation  Comfort (Breast/Nipple): Soft / non-tender  Hold (Positioning): No assistance needed to correctly position infant at breast.  LATCH Score: 10   Lactation Tools Discussed/Used    Interventions Interventions: Support pillows  Discharge    Consult Status Consult Status: PRN Date: 07/06/23 Follow-up type: In-patient    Dyann Kief 07/06/2023, 5:09 PM

## 2023-07-06 NOTE — Progress Notes (Signed)
Postpartum Day  2  Subjective: 37 y.o. G2P1011 postpartum day #1 status post normal spontaneous vaginal delivery. She is ambulating, is tolerating po, is voiding spontaneously.  Her pain is well controlled on PO pain medications. Her lochia is less than menses.  Baby needed bili lights last night for jaundice.  She has not gotten a lot of sleep since Tuesday. Spouse turned off lights and held baby so that she could get a couple of hours this morning.  Feeling a little better after that nap but still needing more rest.  Denies HA, changes in vision, or RUQ pain.  Objective: BP (!) 156/94 (BP Location: Right Arm)   Pulse 100   Temp 98.2 F (36.8 C)   Resp 16   Ht 5\' 6"  (1.676 m)   Wt 93.4 kg   SpO2 98%   Breastfeeding Unknown   BMI 33.25 kg/m   Vitals:   07/04/23 2300 07/05/23 0000 07/05/23 0100 07/05/23 0403  BP: (!) 147/92 (!) 147/75 120/64 128/84   07/05/23 0725 07/05/23 0820 07/05/23 1205 07/05/23 1640  BP: (!) 151/90 (!) 136/96 139/80 (!) 146/97   07/05/23 1924 07/06/23 0012 07/06/23 0301 07/06/23 0759  BP: 138/89 (!) 139/91 (!) 144/90 (!) 156/94      Physical Exam:  General: alert, cooperative, no distress, and pale Breasts: soft/nontender Pulm: nl effort Abdomen: soft, non-tender, active bowel sounds Uterine Fundus: firm Perineum: minimal edema, repair well approximated Lochia: appropriate DVT Evaluation: No evidence of DVT seen on physical exam.  Recent Labs    07/05/23 0539 07/06/23 0823  HGB 8.9* 9.3*  HCT 24.8* 26.4*  WBC 8.7 8.6  PLT 151 170    Assessment/Plan: 37 y.o. G2P1011 postpartum day # 2  1. Continue routine postpartum care  2. Infant feeding status: breast feeding -Lactation consult PRN for breastfeeding  -Infant being monitored for jaundice   3. Contraception plan:  TBD  4. Acute blood loss anemia - clinically significant.  -Hemodynamically stable and asymptomatic -Intervention: continue on oral supplementation with ferrous sulfate 325  and IV iron transfusion with venofer given   5. Immunization status:   all immunizations up to date  6. Preeclampsia with severe features  -Received magnesium sulfate infusion postpartum -Asymptomatic  -Procardia increased to 90 mg daily  -Will start Lasix 20 mg PO daily for 5 days   Disposition: continue inpatient postpartum care , possible discharge this evening if blood pressures stable    LOS: 4 days   Gustavo Lah, CNM 07/06/2023, 10:09 AM   ----- Margaretmary Eddy  Certified Nurse Midwife [facility]

## 2023-07-07 MED ORDER — IBUPROFEN 600 MG PO TABS: 600.0000 mg | ORAL_TABLET | Freq: Four times a day (QID) | ORAL | 0 refills | Status: AC | PRN

## 2023-07-07 MED ORDER — ACETAMINOPHEN 325 MG PO TABS: 1000.0000 mg | ORAL_TABLET | Freq: Four times a day (QID) | ORAL | Status: AC | PRN

## 2023-07-07 MED ORDER — NIFEDIPINE ER OSMOTIC RELEASE 30 MG PO TB24
60.0000 mg | ORAL_TABLET | Freq: Every day | ORAL | Status: DC
  Administered 2023-07-07: 60 mg via ORAL
  Filled 2023-07-07: qty 2

## 2023-07-07 MED ORDER — FERROUS SULFATE 325 (65 FE) MG PO TBEC: 325.0000 mg | DELAYED_RELEASE_TABLET | Freq: Every day | ORAL | Status: DC

## 2023-07-07 MED ORDER — FUROSEMIDE 20 MG PO TABS: 20.0000 mg | ORAL_TABLET | Freq: Every day | ORAL | 0 refills | Status: DC

## 2023-07-07 MED ORDER — SENNOSIDES-DOCUSATE SODIUM 8.6-50 MG PO TABS: 2.0000 | ORAL_TABLET | Freq: Every evening | ORAL | Status: DC | PRN

## 2023-07-07 MED ORDER — LABETALOL HCL 200 MG PO TABS: 200.0000 mg | ORAL_TABLET | Freq: Two times a day (BID) | ORAL | 0 refills | Status: DC

## 2023-07-07 MED ORDER — NIFEDIPINE ER 60 MG PO TB24: 60.0000 mg | ORAL_TABLET | Freq: Every day | ORAL | 1 refills | Status: DC

## 2023-07-07 NOTE — Lactation Note (Signed)
This note was copied from a baby's chart. Lactation Consultation Note  Patient Name: Kayla Townsend [id number] Date: 07/07/2023 Age:36 days Reason for consult: Follow-up assessment;Primapara;Early term 37-38.6wks   Maternal Data This is mom's 1st baby, SVD. Mom with history of anxiety, AMA, GHTN, preeclampsia with severe features.   On follow-up today mom reports she is feeling more confident than she had been. Per parents baby has been latching and breastfeeding with a good amount of wet and stool diapers. Per mom she can feel her breasts getting fuller.  Has patient been taught Hand Expression?: Yes Does the patient have breastfeeding experience prior to this delivery?: No  Feeding Mother's Current Feeding Choice: Breast Milk  Interventions Interventions: Breast feeding basics reviewed;Education  Discharge Discharge Education: Engorgement and breast care;Warning signs for feeding baby;Outpatient recommendation Pump: Personal  Consult Status Consult Status: Complete Date: 07/07/23 Follow-up type: Call as needed  Updated care nurse.  Fuller Song 07/07/2023, 11:17 AM

## 2023-07-07 NOTE — Progress Notes (Signed)
Discharge instructions reviewed with patient and significant other.  Printed copies given to patient for reference after discharge home.  Questions answered and follow up care reviewed.

## 2023-07-11 DIAGNOSIS — O133 Gestational [pregnancy-induced] hypertension without significant proteinuria, third trimester: Secondary | ICD-10-CM | POA: Diagnosis not present

## 2023-07-23 DIAGNOSIS — Z8659 Personal history of other mental and behavioral disorders: Secondary | ICD-10-CM | POA: Diagnosis not present

## 2023-07-23 DIAGNOSIS — F53 Postpartum depression: Secondary | ICD-10-CM | POA: Diagnosis not present

## 2023-08-14 DIAGNOSIS — Z1332 Encounter for screening for maternal depression: Secondary | ICD-10-CM | POA: Diagnosis not present

## 2023-08-14 DIAGNOSIS — Z30011 Encounter for initial prescription of contraceptive pills: Secondary | ICD-10-CM | POA: Diagnosis not present

## 2023-08-23 ENCOUNTER — Telehealth: Payer: Self-pay | Admitting: Lactation Services

## 2023-08-23 NOTE — Telephone Encounter (Signed)
Mom has 37 wk old baby that is doing well and gaining wt on breastfeeding and pumping EBM, she gives baby 3 bottles of EBM a day primarily at night so she and husband can rest.  Baby nurses q 1-2 hrs this past week on one breast and she pumps the other breast while baby nursing each feeding.  She just started back to work on Mondays only and wants to know how to pump to save enough milk for 3 bottles while at work.  Recommended offering baby both breasts at a feeding to space feedings out some and then pump possible 4 x a day in between feedings and in the am to store enough milk for their needs daily and when she works.  If she is unable to find a strategy that works for her family and if she needs more assistance, call LC for phone consult or to make LC appt in office.

## 2023-08-26 DIAGNOSIS — F418 Other specified anxiety disorders: Secondary | ICD-10-CM | POA: Diagnosis not present

## 2023-08-26 DIAGNOSIS — F53 Postpartum depression: Secondary | ICD-10-CM | POA: Diagnosis not present

## 2023-08-26 DIAGNOSIS — N898 Other specified noninflammatory disorders of vagina: Secondary | ICD-10-CM | POA: Diagnosis not present

## 2023-08-26 DIAGNOSIS — Z30011 Encounter for initial prescription of contraceptive pills: Secondary | ICD-10-CM | POA: Diagnosis not present

## 2023-08-26 DIAGNOSIS — O99345 Other mental disorders complicating the puerperium: Secondary | ICD-10-CM | POA: Diagnosis not present

## 2024-01-08 ENCOUNTER — Other Ambulatory Visit: Payer: Self-pay | Admitting: Nurse Practitioner

## 2024-01-09 ENCOUNTER — Ambulatory Visit: Admitting: Nurse Practitioner

## 2024-01-09 ENCOUNTER — Encounter: Payer: Self-pay | Admitting: Nurse Practitioner

## 2024-01-09 VITALS — BP 111/66 | HR 61 | Temp 98.7°F | Ht 66.0 in | Wt 177.8 lb

## 2024-01-09 DIAGNOSIS — R1013 Epigastric pain: Secondary | ICD-10-CM | POA: Insufficient documentation

## 2024-01-09 NOTE — Assessment & Plan Note (Signed)
 Starting 4 days ago, noticing after some meals.  No red flags on exam.  Suspect reflux issues presenting.  Recommend she start OTC Pepcid 20 MG once or twice a day.  Monitor foods via a food journal to assist in documenting which foods may cause symptoms, try to avoid those foods when possible.  Will check CBC, CMP, GGT today.  Return in 3 weeks.

## 2024-01-09 NOTE — Patient Instructions (Signed)
 Try taking Pepcid 20 MG once or twice daily for reflux.  GERD in Adults: Diet Changes When you have gastroesophageal reflux disease (GERD), you may need to make changes to your diet. Choosing the right foods can help with your symptoms. Think about working with an expert in healthy eating called a dietitian. They can help you make healthy food choices. What are tips for following this plan? Reading food labels Look for foods that are low in saturated fat. Foods that may help with your symptoms include: Foods with less than 5% of daily value (DV) of fat. Foods with 0 grams of trans fat. Cooking [employer] in ways that don't use a lot of fat. These ways include: Baking. Steaming. Grilling. Broiling. To add flavor, try to use herbs that are low in spice and acidity. Avoid frying your food. Meal planning  Eat small meals often rather than eating 3 large meals each day. Eat your meals slowly in a place where you feel relaxed. If told by your health care provider, avoid: Foods that cause symptoms. Keep a food diary to keep track of foods that cause symptoms. Alcohol. Drinking a lot of liquid with meals. General instructions For 2-3 hours after you eat, avoid: Bending over. Exercise. Lying down. Chew sugar-free gum after meals. What foods should I eat? Eat a healthy diet. Try to include: Foods with high amounts of fiber. These include: Fruits and vegetables. Whole grains and beans. Low-fat dairy products. Lean meats, fish, and poultry. Egg whites. Foods that cause symptoms in someone else may not cause symptoms for you. Work with your provider to find foods that are safe for you. The items listed above may not be all the foods and drinks you can have. Talk with a dietitian to learn more. The items listed above may not be a complete list of foods and beverages you can eat and drink. Contact a dietitian for more information. What foods should I avoid? Limiting some of these  foods may help with your symptoms. Each person is different. Talk with a dietitian or your provider to help you find the exact foods to avoid. Some of the foods to avoid may include: Fruits Fruits with a lot of acid in them. These may include citrus fruits, such as oranges, grapefruit, pineapple, and lemons. Vegetables Deep-fried vegetables, such as [place] fries. Vegetables, sauces, or toppings made with added fat and vegetables with acid in them. These may include tomatoes and tomato products, chili peppers, onions, garlic, and horseradish. Grains Pastries or quick breads with added fat. Meats and other proteins High-fat meats, such as fatty beef or pork, hot dogs, ribs, ham, sausage, salami, and bacon. Fried meat or protein, such as fried fish and fried chicken. Egg yolks. Fats and oils Butter. Margarine. Shortening. Ghee. Drinks Coffee and other drinks with caffeine in them. Fizzy and sugary drinks, such as soda and energy drinks. Fruit juice made with acidic fruits, such as orange or grapefruit. Tomato juice. Sweets and desserts Chocolate and cocoa. Donuts. Seasonings and condiments Mint, such as peppermint and spearmint. Condiments, herbs, or seasonings that cause symptoms. These may include curry, hot sauce, or vinegar-based salad dressings. The items listed above may not be all the foods and drinks you should avoid. Talk with a dietitian to learn more. Questions to ask your health care provider Changes to your diet and everyday life are often the first steps taken to manage symptoms of GERD. If these changes don't help, talk with your provider about  taking medicines. Where to find more information [employer]: aboutgerd.org This information is not intended to replace advice given to you by your health care provider. Make sure you discuss any questions you have with your health care provider. Document Revised: 07/30/2023 Document Reviewed:  02/13/2023 Elsevier Patient Education  2024 [employer].

## 2024-01-09 NOTE — Progress Notes (Signed)
 BP 111/66   Pulse 61   Temp 98.7 F (37.1 C) (Oral)   Ht 5\' 6"  (1.676 m)   Wt 177 lb 12.8 oz (80.6 kg)   SpO2 97%   BMI 28.70 kg/m    Subjective:    Patient ID: Kayla Townsend, female    DOB: 07/03/1986, 38 y.o.   MRN: [id number]  HPI: Kayla Townsend is a 38 y.o. female  Chief Complaint  Patient presents with   Pain    Patient states she has been feeling a sharp pain in the middle of her abdomen for the last 4 days. States she only feels the pain once per day and then feels pressure down both sides of her abdomen.    ABDOMINAL PAIN  Started 5 days ago.  1st time it was an hour after dinner, she had laid down on couch at time - took [place], it went away.  Other episodes presented after eating as well, but she had not laid down at the time. [place] water at home.  No recent exposures. Variety of foods bring it on.  Has gall bladder. Duration:days/ Onset: sudden Severity: 7/10 Quality: sharp, aching, pressure-like, and throbbing Location:  epigastric and at times radiates down sides Episode duration: 1/2 hours or less Radiation: down both sides at times Frequency: intermittent Alleviating factors: Pepto Bismol Aggravating factors: eating, lying down Status: stable Treatments attempted: Pepto Bismol, drinking water Fever: no Nausea: yes occasional with pain Vomiting: no Weight loss: no Decreased appetite: no Diarrhea: no Constipation: no Blood in stool:  occasional when wipes, had baby in October Heartburn: yes Jaundice: no Rash: no Dysuria/urinary frequency: no Hematuria: no History of sexually transmitted disease: no Recurrent NSAID use: no   Relevant past medical, surgical, family and social history reviewed and updated as indicated. Interim medical history since our last visit reviewed. Allergies and medications reviewed and updated.  Review of Systems  Constitutional:  Negative for activity change, appetite change, diaphoresis, fatigue and  fever.  Respiratory:  Negative for cough, chest tightness, shortness of breath and wheezing.   Cardiovascular:  Negative for chest pain, palpitations and leg swelling.  Gastrointestinal:  Positive for abdominal pain (epigastric) and nausea. Negative for abdominal distention, constipation, diarrhea and vomiting.  Neurological: Negative.   Psychiatric/Behavioral: Negative.     Per HPI unless specifically indicated above     Objective:    BP 111/66   Pulse 61   Temp 98.7 F (37.1 C) (Oral)   Ht 5\' 6"  (1.676 m)   Wt 177 lb 12.8 oz (80.6 kg)   SpO2 97%   BMI 28.70 kg/m   Wt Readings from Last 3 Encounters:  01/09/24 177 lb 12.8 oz (80.6 kg)  07/02/23 206 lb (93.4 kg)  06/20/23 208 lb (94.3 kg)    Physical Exam Vitals and nursing note reviewed.  Constitutional:      General: She is awake. She is not in acute distress.    Appearance: She is well-developed and well-groomed. She is not ill-appearing or toxic-appearing.  HENT:     Head: Normocephalic.     Right Ear: Hearing and external ear normal.     Left Ear: Hearing and external ear normal.  Eyes:     General: Lids are normal.        Right eye: No discharge.        Left eye: No discharge.     Conjunctiva/sclera: Conjunctivae normal.     Pupils: Pupils are equal, round, and reactive  to light.  Neck:     Thyroid: No thyromegaly.     Vascular: No carotid bruit.  Cardiovascular:     Rate and Rhythm: Normal rate and regular rhythm.     Heart sounds: Normal heart sounds. No murmur heard.    No gallop.  Pulmonary:     Effort: Pulmonary effort is normal. No accessory muscle usage or respiratory distress.     Breath sounds: Normal breath sounds.  Abdominal:     General: Bowel sounds are normal. There is no distension.     Palpations: Abdomen is soft.     Tenderness: There is no abdominal tenderness. There is no right CVA tenderness, left CVA tenderness, guarding or rebound.  Musculoskeletal:     Cervical back: Normal range of  motion and neck supple.     Right lower leg: No edema.     Left lower leg: No edema.  Lymphadenopathy:     Cervical: No cervical adenopathy.  Skin:    General: Skin is warm and dry.  Neurological:     Mental Status: She is alert and oriented to person, place, and time.     Deep Tendon Reflexes: Reflexes are normal and symmetric.     Reflex Scores:      Brachioradialis reflexes are 2+ on the right side and 2+ on the left side.      Patellar reflexes are 2+ on the right side and 2+ on the left side. Psychiatric:        Attention and Perception: Attention normal.        Mood and Affect: Mood normal.        Speech: Speech normal.        Behavior: Behavior normal. Behavior is cooperative.        Thought Content: Thought content normal.    Results for orders placed or performed during the hospital encounter of 07/02/23  OB RESULTS CONSOLE Rubella Antibody   Collection Time: 01/08/23 12:00 AM  Result Value Ref Range   Rubella Immune   OB RESULTS CONSOLE Varicella zoster antibody, IgG   Collection Time: 01/08/23 12:00 AM  Result Value Ref Range   Varicella Immune   OB RESULTS CONSOLE Hepatitis B surface antigen   Collection Time: 01/08/23 12:00 AM  Result Value Ref Range   Hepatitis B Surface Ag Negative   OB RESULT CONSOLE Group B Strep   Collection Time: 06/24/23 12:00 AM  Result Value Ref Range   GBS Negative   OB RESULTS CONSOLE GC/Chlamydia   Collection Time: 06/24/23 12:00 AM  Result Value Ref Range   Chlamydia Negative    Neisseria Gonorrhea Negative   OB RESULTS CONSOLE HIV antibody   Collection Time: 06/24/23 12:00 AM  Result Value Ref Range   HIV Non-reactive   Protein / creatinine ratio, urine   Collection Time: 07/02/23 12:13 PM  Result Value Ref Range   Creatinine, Urine 45 mg/dL   Total Protein, Urine 17 mg/dL   Protein Creatinine Ratio 0.38 (H) 0.00 - 0.15 mg/mg[Cre]  Type and screen [facility]   Collection Time: 07/02/23  1:07 PM   Result Value Ref Range   ABO/RH(D) A POS    Antibody Screen NEG    Sample Expiration      07/05/2023,2359 Performed at [facility], [address] [place], [place] [address]   RPR   Collection Time: 07/02/23  1:11 PM  Result Value Ref Range   RPR Ser Ql NON REACTIVE NON REACTIVE  CBC   Collection Time: 07/02/23  1:11 PM  Result Value Ref Range   WBC 9.4 4.0 - 10.5 K/uL   RBC 3.63 (L) 3.87 - 5.11 MIL/uL   Hemoglobin 11.9 (L) 12.0 - 15.0 g/dL   HCT 95.6 (L) 38.7 - 56.4 %   MCV 90.4 80.0 - 100.0 fL   MCH 32.8 26.0 - 34.0 pg   MCHC 36.3 (H) 30.0 - 36.0 g/dL   RDW 33.2 95.1 - 88.4 %   Platelets 171 150 - 400 K/uL   nRBC 0.0 0.0 - 0.2 %  Comprehensive metabolic panel   Collection Time: 07/02/23  1:11 PM  Result Value Ref Range   Sodium 136 135 - 145 mmol/L   Potassium 3.3 (L) 3.5 - 5.1 mmol/L   Chloride 106 98 - 111 mmol/L   CO2 21 (L) 22 - 32 mmol/L   Glucose, Bld 107 (H) 70 - 99 mg/dL   BUN 9 6 - 20 mg/dL   Creatinine, Ser 1.66 0.44 - 1.00 mg/dL   Calcium 9.6 8.9 - 06.3 mg/dL   Total Protein 6.1 (L) 6.5 - 8.1 g/dL   Albumin 2.9 (L) 3.5 - 5.0 g/dL   AST 38 15 - 41 U/L   ALT 26 0 - 44 U/L   Alkaline Phosphatase 130 (H) 38 - 126 U/L   Total Bilirubin 0.3 0.3 - 1.2 mg/dL   GFR, Estimated >01 >60 mL/min   Anion gap 9 5 - 15  CBC   Collection Time: 07/03/23 11:47 AM  Result Value Ref Range   WBC 13.4 (H) 4.0 - 10.5 K/uL   RBC 4.19 3.87 - 5.11 MIL/uL   Hemoglobin 13.4 12.0 - 15.0 g/dL   HCT 10.9 32.3 - 55.7 %   MCV 90.9 80.0 - 100.0 fL   MCH 32.0 26.0 - 34.0 pg   MCHC 35.2 30.0 - 36.0 g/dL   RDW 32.2 02.5 - 42.7 %   Platelets 184 150 - 400 K/uL   nRBC 0.0 0.0 - 0.2 %  Surgical pathology   Collection Time: 07/04/23 12:00 AM  Result Value Ref Range   SURGICAL PATHOLOGY      SURGICAL PATHOLOGY [facility] [address], [address] [place], [place] [address] Telephone [phone] or [phone] Fax [phone]  REPORT OF SURGICAL  PATHOLOGY   Accession #: [id number] Patient Name: SHAAKIRA, BORRERO Visit # : [id number]  MRN: [id number] Physician: Sonny Dandy DOB/Age [birth year] (Age: 56) Gender: F Collected Date: 07/04/2023 Received Date: 07/04/2023  FINAL DIAGNOSIS       1. Placenta,  :       - TERM DISCOID PLACENTA WEIGHING 584.3 G (APPROXIMATELY 10TH PERCENTILE FOR      PROVIDED GESTATIONAL AGE).      - UNREMARKABLE FETAL MEMBRANES, MEMBRANOUS DECIDUA, CHORIONIC PLATE, CHORIONIC      VASCULATURE, INTERVILLOUS SPACE, AND BASAL PLATE.      - APPROPRIATE MATURATION OF FETAL STEM AND TERMINAL VILLI.      - SEE NOTE.       Diagnosis Note : The clinical concern for abruption is noted. While histologic      features of abruption are not identified pathologic changes may not be seen in      all  cases of abruption. Correlation with clinical impression is required.      DATE SIGNED OUT: 07/05/2023 ELECTRONIC SIGNATURE : Oneita Kras Md, Delice Bison , Pathologist, Electronic Signature  MICROSCOPIC DESCRIPTION  CASE COMMENTS STAINS USED IN DIAGNOSIS:  H&E H&E H&E H&E    CLINICAL HISTORY  SPECIMEN(S) OBTAINED 1. Placenta,  SPECIMEN COMMENTS: SPECIMEN CLINICAL INFORMATION: 1. Possible abruption.  Pre-eclampsia.  GA 37 and 2    Gross Description 1. Specimen received: Intact, singleton placenta.      Size and shape: 22.0 x 18.2 x 3.0 cm; discoid.      Weight: 584.3 g      Umbilical cord: 47.0 cm long, 1.2 cm in diameter with a central cord insertion,      8.5 cm from the disc edge. The cord is mildly, diffusely edematous and has 3      vessels on cut surface.      Membranes: Tan-pink with patchy tan-white opacities. The membranes insert 100%      marginally.      Fetal surface: Purple-blue and glistening with minimal patchy subchorionic      fibrin depositi on. The vasculature is of small to medium caliber and well      arborizing.      Maternal surface: Tan-red with well formed, intact  cotyledons. There are      prominent tan-white, chalky calcifications and fibrin deposition covering up to      15% of the total surface area.      Cut surface: . There are multiple calcified, tan-white lesions underlying the      maternal surface up to 2.5 cm in greatest dimension. No additional masses or      lesions are grossly identified.      Block summary:      1A: Membrane roll and umbilical cord      1B-1D: Representative full thickness sections, including calcified areas      SMB      07/04/2023        Report signed out from the following location(s) [place]. [facility] [address], [place] [address] CLIA #: [id number]  [facility] [address] [place], [place] [address] CLIA #: [id number]   CBC   Collection Time: 07/04/23  4:15 AM  Result Value Ref Range   WBC 21.8 (H) 4.0 - 10.5 K/uL   RBC 3.38 (L) 3.87 - 5.11 MIL/uL   Hemoglobin 10.9 (L) 12.0 - 15.0 g/dL   HCT 72.5 (L) 36.6 - 44.0 %   MCV 88.5 80.0 - 100.0 fL   MCH 32.2 26.0 - 34.0 pg   MCHC 36.5 (H) 30.0 - 36.0 g/dL   RDW 34.7 42.5 - 95.6 %   Platelets 205 150 - 400 K/uL   nRBC 0.0 0.0 - 0.2 %  CBC   Collection Time: 07/04/23  7:51 AM  Result Value Ref Range   WBC 21.3 (H) 4.0 - 10.5 K/uL   RBC 3.08 (L) 3.87 - 5.11 MIL/uL   Hemoglobin 10.0 (L) 12.0 - 15.0 g/dL   HCT 38.7 (L) 56.4 - 33.2 %   MCV 91.6 80.0 - 100.0 fL   MCH 32.5 26.0 - 34.0 pg   MCHC 35.5 30.0 - 36.0 g/dL   RDW 95.1 88.4 - 16.6 %   Platelets 192 150 - 400 K/uL   nRBC 0.0 0.0 - 0.2 %  Comprehensive metabolic panel   Collection Time: 07/04/23  5:56 PM  Result Value Ref Range   Sodium 131 (L) 135 - 145 mmol/L   Potassium 2.7 (LL) 3.5 - 5.1 mmol/L   Chloride 98 98 - 111 mmol/L   CO2 23 22 - 32 mmol/L   Glucose, Bld 127 (H)  70 - 99 mg/dL   BUN 12 6 - 20 mg/dL   Creatinine, Ser 9.62 0.44 - 1.00 mg/dL   Calcium 6.0 (LL) 8.9 - 10.3 mg/dL   Total Protein 5.0 (L) 6.5 - 8.1 g/dL   Albumin 2.3 (L)  3.5 - 5.0 g/dL   AST 33 15 - 41 U/L   ALT 21 0 - 44 U/L   Alkaline Phosphatase 102 38 - 126 U/L   Total Bilirubin 0.6 0.3 - 1.2 mg/dL   GFR, Estimated >95 >28 mL/min   Anion gap 10 5 - 15  CBC   Collection Time: 07/04/23  5:56 PM  Result Value Ref Range   WBC 15.2 (H) 4.0 - 10.5 K/uL   RBC 2.84 (L) 3.87 - 5.11 MIL/uL   Hemoglobin 9.3 (L) 12.0 - 15.0 g/dL   HCT 41.3 (L) 24.4 - 01.0 %   MCV 90.5 80.0 - 100.0 fL   MCH 32.7 26.0 - 34.0 pg   MCHC 36.2 (H) 30.0 - 36.0 g/dL   RDW 27.2 53.6 - 64.4 %   Platelets 184 150 - 400 K/uL   nRBC 0.0 0.0 - 0.2 %  Magnesium   Collection Time: 07/04/23  5:56 PM  Result Value Ref Range   Magnesium 6.4 (HH) 1.7 - 2.4 mg/dL  CBC   Collection Time: 07/04/23 11:55 PM  Result Value Ref Range   WBC 11.5 (H) 4.0 - 10.5 K/uL   RBC 2.69 (L) 3.87 - 5.11 MIL/uL   Hemoglobin 8.8 (L) 12.0 - 15.0 g/dL   HCT 03.4 (L) 74.2 - 59.5 %   MCV 91.4 80.0 - 100.0 fL   MCH 32.7 26.0 - 34.0 pg   MCHC 35.8 30.0 - 36.0 g/dL   RDW 63.8 75.6 - 43.3 %   Platelets 163 150 - 400 K/uL   nRBC 0.0 0.0 - 0.2 %  CBC   Collection Time: 07/05/23  5:39 AM  Result Value Ref Range   WBC 8.7 4.0 - 10.5 K/uL   RBC 2.75 (L) 3.87 - 5.11 MIL/uL   Hemoglobin 8.9 (L) 12.0 - 15.0 g/dL   HCT 29.5 (L) 18.8 - 41.6 %   MCV 90.2 80.0 - 100.0 fL   MCH 32.4 26.0 - 34.0 pg   MCHC 35.9 30.0 - 36.0 g/dL   RDW 60.6 30.1 - 60.1 %   Platelets 151 150 - 400 K/uL   nRBC 0.0 0.0 - 0.2 %  Comprehensive metabolic panel   Collection Time: 07/05/23  5:39 AM  Result Value Ref Range   Sodium 136 135 - 145 mmol/L   Potassium 3.4 (L) 3.5 - 5.1 mmol/L   Chloride 107 98 - 111 mmol/L   CO2 23 22 - 32 mmol/L   Glucose, Bld 90 70 - 99 mg/dL   BUN 10 6 - 20 mg/dL   Creatinine, Ser 0.93 0.44 - 1.00 mg/dL   Calcium 5.8 (LL) 8.9 - 10.3 mg/dL   Total Protein 5.1 (L) 6.5 - 8.1 g/dL   Albumin 2.3 (L) 3.5 - 5.0 g/dL   AST 31 15 - 41 U/L   ALT 21 0 - 44 U/L   Alkaline Phosphatase 82 38 - 126 U/L   Total  Bilirubin 0.4 0.3 - 1.2 mg/dL   GFR, Estimated >23 >55 mL/min   Anion gap 6 5 - 15  Basic metabolic panel   Collection Time: 07/06/23  8:23 AM  Result Value Ref Range   Sodium 136 135 - 145 mmol/L  Potassium 3.4 (L) 3.5 - 5.1 mmol/L   Chloride 107 98 - 111 mmol/L   CO2 23 22 - 32 mmol/L   Glucose, Bld 81 70 - 99 mg/dL   BUN 9 6 - 20 mg/dL   Creatinine, Ser 1.61 0.44 - 1.00 mg/dL   Calcium 7.4 (L) 8.9 - 10.3 mg/dL   GFR, Estimated >09 >60 mL/min   Anion gap 6 5 - 15  CBC   Collection Time: 07/06/23  8:23 AM  Result Value Ref Range   WBC 8.6 4.0 - 10.5 K/uL   RBC 2.85 (L) 3.87 - 5.11 MIL/uL   Hemoglobin 9.3 (L) 12.0 - 15.0 g/dL   HCT 45.4 (L) 09.8 - 11.9 %   MCV 92.6 80.0 - 100.0 fL   MCH 32.6 26.0 - 34.0 pg   MCHC 35.2 30.0 - 36.0 g/dL   RDW 14.7 82.9 - 56.2 %   Platelets 170 150 - 400 K/uL   nRBC 0.2 0.0 - 0.2 %      Assessment & Plan:   Problem List Items Addressed This Visit       Other   Epigastric pain - Primary   Starting 4 days ago, noticing after some meals.  No red flags on exam.  Suspect reflux issues presenting.  Recommend she start OTC Pepcid 20 MG once or twice a day.  Monitor foods via a food journal to assist in documenting which foods may cause symptoms, try to avoid those foods when possible.  Will check CBC, CMP, GGT today.  Return in 3 weeks.      Relevant Orders   Comprehensive metabolic panel with GFR   CBC with Differential/Platelet   Gamma GT     Follow up plan: Return in about 3 weeks (around 01/30/2024) for GERD - can be virtual if needed.

## 2024-01-10 ENCOUNTER — Encounter: Payer: Self-pay | Admitting: Nurse Practitioner

## 2024-01-10 DIAGNOSIS — R1013 Epigastric pain: Secondary | ICD-10-CM

## 2024-01-10 DIAGNOSIS — R7989 Other specified abnormal findings of blood chemistry: Secondary | ICD-10-CM

## 2024-01-10 LAB — COMPREHENSIVE METABOLIC PANEL WITH GFR
ALT: 173 IU/L — ABNORMAL HIGH (ref 0–32)
AST: 93 IU/L — ABNORMAL HIGH (ref 0–40)
Albumin: 4.6 g/dL (ref 3.9–4.9)
Alkaline Phosphatase: 126 IU/L — ABNORMAL HIGH (ref 44–121)
BUN/Creatinine Ratio: 20 (ref 9–23)
BUN: 15 mg/dL (ref 6–20)
Bilirubin Total: 0.4 mg/dL (ref 0.0–1.2)
CO2: 24 mmol/L (ref 20–29)
Calcium: 9.7 mg/dL (ref 8.7–10.2)
Chloride: 102 mmol/L (ref 96–106)
Creatinine, Ser: 0.76 mg/dL (ref 0.57–1.00)
Globulin, Total: 2.2 g/dL (ref 1.5–4.5)
Glucose: 85 mg/dL (ref 70–99)
Potassium: 3.9 mmol/L (ref 3.5–5.2)
Sodium: 140 mmol/L (ref 134–144)
Total Protein: 6.8 g/dL (ref 6.0–8.5)
eGFR: 104 mL/min/{1.73_m2} (ref >59)

## 2024-01-10 LAB — CBC WITH DIFFERENTIAL/PLATELET
Basophils Absolute: 0 10*3/uL (ref 0.0–0.2)
Basos: 1 %
EOS (ABSOLUTE): 0.2 10*3/uL (ref 0.0–0.4)
Eos: 3 %
Hematocrit: 41.8 % (ref 34.0–46.6)
Hemoglobin: 14.3 g/dL (ref 11.1–15.9)
Immature Grans (Abs): 0 10*3/uL (ref 0.0–0.1)
Immature Granulocytes: 0 %
Lymphocytes Absolute: 1.7 10*3/uL (ref 0.7–3.1)
Lymphs: 27 %
MCH: 31.4 pg (ref 26.6–33.0)
MCHC: 34.2 g/dL (ref 31.5–35.7)
MCV: 92 fL (ref 79–97)
Monocytes Absolute: 0.5 10*3/uL (ref 0.1–0.9)
Monocytes: 8 %
Neutrophils Absolute: 3.9 10*3/uL (ref 1.4–7.0)
Neutrophils: 61 %
Platelets: 247 10*3/uL (ref 150–450)
RBC: 4.55 x10E6/uL (ref 3.77–5.28)
RDW: 12.7 % (ref 11.7–15.4)
WBC: 6.4 10*3/uL (ref 3.4–10.8)

## 2024-01-10 LAB — GAMMA GT: GGT: 126 IU/L — ABNORMAL HIGH (ref 0–60)

## 2024-01-10 NOTE — Progress Notes (Signed)
 Contacted via MyChart   Good afternoon Kayla Townsend, your labs have returned and I highly suspect your current pain is coming from gall bladder issues.  Your liver function testing, AST and ALT, alkaline phosphatase and GGT are all elevated.  Based on your symptoms this often points toward gall bladder.  I am going to order and ultrasound to be done as soon as possible to check on liver and gall bladder.  The good news is you were not tender on exam yesterday.  However, if over weekend your pain worsens or you have other symptoms like nausea and vomiting then immediately go to the ER.  Any questions?  Please let me know you received this. Keep being awesome!!  Thank you for allowing me to participate in your care.  I appreciate you. Kindest regards, Navneet Schmuck

## 2024-01-20 ENCOUNTER — Ambulatory Visit
Admission: RE | Admit: 2024-01-20 | Discharge: 2024-01-20 | Disposition: A | Discharge status: Home / Self Care | Source: Ambulatory Visit | Attending: Nurse Practitioner | Admitting: Nurse Practitioner

## 2024-01-20 ENCOUNTER — Encounter: Payer: Self-pay | Admitting: Nurse Practitioner

## 2024-01-20 DIAGNOSIS — K8020 Calculus of gallbladder without cholecystitis without obstruction: Secondary | ICD-10-CM | POA: Diagnosis not present

## 2024-01-20 DIAGNOSIS — R1013 Epigastric pain: Secondary | ICD-10-CM

## 2024-01-20 DIAGNOSIS — R7989 Other specified abnormal findings of blood chemistry: Secondary | ICD-10-CM | POA: Diagnosis not present

## 2024-01-20 DIAGNOSIS — R1010 Upper abdominal pain, unspecified: Secondary | ICD-10-CM | POA: Diagnosis not present

## 2024-01-20 NOTE — Progress Notes (Signed)
 Contacted via MyChart   Good afternoon Kayla Townsend, your imaging has returned and is showing multiple gallstones, but no inflammation of gall bladder.  Due to your current pain would you like a referral to general surgery for recommendations on this?  Let me know.  Any questions?

## 2024-01-24 DIAGNOSIS — K8020 Calculus of gallbladder without cholecystitis without obstruction: Secondary | ICD-10-CM | POA: Insufficient documentation

## 2024-01-31 ENCOUNTER — Encounter: Payer: Self-pay | Admitting: Surgery

## 2024-01-31 ENCOUNTER — Ambulatory Visit: Payer: Self-pay | Admitting: Surgery

## 2024-01-31 VITALS — BP 106/72 | HR 72 | Ht 65.0 in | Wt 175.0 lb

## 2024-01-31 DIAGNOSIS — K8020 Calculus of gallbladder without cholecystitis without obstruction: Secondary | ICD-10-CM

## 2024-01-31 DIAGNOSIS — R7989 Other specified abnormal findings of blood chemistry: Secondary | ICD-10-CM

## 2024-01-31 NOTE — Patient Instructions (Addendum)
 Follow-up with our office in 1 months for a reassessment.   Please call and ask to speak with a nurse if you develop questions or concerns.   Gallbladder Eating Plan High blood cholesterol, obesity, a sedentary lifestyle, an unhealthy diet, and diabetes are risk factors for developing gallstones. If you have a gallbladder condition, you may have trouble digesting fats and tolerating high fat intake. Eating a low-fat diet can help reduce your symptoms and may be helpful before and after having surgery to remove your gallbladder (cholecystectomy). Your health care provider may recommend that you work with a dietitian to help you reduce the amount of fat in your diet. What are tips for following this plan? General guidelines Limit your fat intake to less than 30% of your total daily calories. If you eat around 1,800 calories each day, this means eating less than 60 grams (g) of fat per day. Fat is an important part of a healthy diet. Eating a low-fat diet can make it hard to maintain a healthy body weight. Ask your dietitian how much fat, calories, and other nutrients you need each day. Eat small, frequent meals throughout the day instead of three large meals. Drink at least 8-10 cups (1.9-2.4 L) of fluid a day. Drink enough fluid to keep your urine pale yellow. If you drink alcohol: Limit how much you have to: 0-1 drink a day for women who are not pregnant. 0-2 drinks a day for men. Know how much alcohol is in a drink. In the [place] one drink equals one 12 oz bottle of beer (355 mL), one 5 oz glass of wine (148 mL), or one 1 oz glass of hard liquor (44 mL). Reading food labels  Check nutrition facts on food labels for the amount of fat per serving. Choose foods with less than 3 grams of fat per serving. Shopping Choose nonfat and low-fat healthy foods. Look for the words "nonfat," "low-fat," or "fat-free." Avoid buying processed or prepackaged foods. Cooking Cook using low-fat methods, such  as baking, broiling, grilling, or boiling. Cook with small amounts of healthy fats, such as olive oil, grapeseed oil, canola oil, avocado oil, or sunflower oil. What foods are recommended?  All fresh, frozen, or canned fruits and vegetables. Whole grains. Low-fat or nonfat (skim) milk and yogurt. Lean meat, skinless poultry, fish, eggs, and beans. Low-fat protein supplement powders or drinks. Spices and herbs. The items listed above may not be a complete list of foods and beverages you can eat and drink. Contact a dietitian for more information. What foods are not recommended? High-fat foods. These include baked goods, fast food, fatty cuts of meat, ice cream, french toast, sweet rolls, pizza, cheese bread, foods covered with butter, creamy sauces, or cheese. Fried foods. These include french fries, tempura, battered fish, breaded chicken, fried breads, and sweets. Foods that cause bloating and gas. The items listed above may not be a complete list of foods that you should avoid. Contact a dietitian for more information. Summary A low-fat diet can be helpful if you have a gallbladder condition, or before and after gallbladder surgery. Limit your fat intake to less than 30% of your total daily calories. This is about 60 g of fat if you eat 1,800 calories each day. Eat small, frequent meals throughout the day instead of three large meals. This information is not intended to replace advice given to you by your health care provider. Make sure you discuss any questions you have with your health care provider.  Document Revised: 09/01/2021 Document Reviewed: 09/01/2021 Elsevier Patient Education  2024 [employer] Cholelithiasis  Cholelithiasis happens when gallstones form in the gallbladder. The gallbladder stores bile. Bile is a fluid that helps digest fats. Bile can harden and form into gallstones. If they cause a blockage, they can cause pain (gallbladder attack). What are the causes? This  condition may be caused by: Too much bilirubin in the bile. This happens if you have sickle cell anemia. Too much of a fat-like substance (cholesterol) in your bile. Not enough bile salts in your bile. These salts help the body absorb and digest fats. The gallbladder not emptying fully or often enough. This is common in pregnant women. What increases the risk? The following factors may make you more likely to develop this condition: Being older than age 31. Eating a lot of fried foods, fat, and refined carbs (refined carbohydrates). Being female. Being pregnant many times. Using medicines with female hormones in them for a long time. Losing weight fast. Having gallstones in your family. Having health problems, such as diabetes, obesity, Crohn's disease, or liver disease. What are the signs or symptoms? Often, there may be gallstones but no symptoms. These gallstones are called silent gallstones. If a gallstone causes a blockage, you may get sudden pain. The pain: Can be in the upper right part of your belly (abdomen). Normally comes at night or after you eat. Can last an hour or more. Can spread to your right shoulder, back, or chest. Can feel like discomfort, burning, or fullness in the upper part of your belly (indigestion). If the blockage lasts more than a few hours, you can get an infection or swelling. You may: Vomit or feel like you may vomit (nauseous). Feel bloated. Have belly pain for 5 hours or more. Feel tender in your belly, often in the upper right part and under your ribs. Have a fever or chills. Have skin or the white parts of your eyes turn yellow (jaundice). Have dark pee (urine) or pale poop (stool). How is this treated? Treatment for this condition depends on how bad you feel. If you have symptoms, you may need: Home care, if symptoms are not very bad. Do not eat for 12-24 hours. Drink only water and clear liquids. After 1 or 2 days, start to eat simple or clear  foods. Try broth and crackers. You may need medicines for pain or stomach upset or both. If you have an infection, you will need antibiotics. A hospital stay, if you have very bad pain or a very bad infection. Surgery to remove your gallbladder. You may need this if: Gallstones keep coming back. You have very bad symptoms. Medicines to break up gallstones. Medicines may be used for 6-12 months. A procedure to find and take out gallstones or to break up gallstones. Follow these instructions at home: Medicines Take over-the-counter and prescription medicines only as told by your doctor. If you were prescribed antibiotics, take them as told by your doctor. Do not stop taking them even if you start to feel better. Ask your doctor if you should avoid driving or using machines while you are taking your medicine. Eating and drinking Drink enough fluid to keep your pee pale yellow. Drink water or clear fluids. This is important when you have pain. Eat healthy foods. Choose: Fewer fatty foods, such as fried foods. Fewer refined carbs. Avoid breads and grains that are highly processed, such as white bread and white rice. Choose whole grains, such as whole-wheat bread  and brown rice. More fiber. Almonds, fresh fruit, and beans are healthy sources. General instructions Keep a healthy weight. Keep all follow-up visits. You may need to see a specialist or a Careers adviser. Where to find more information [employer] of Diabetes and Digestive [employer] Kidney Diseases: [email or web address] Contact a doctor if: You have sudden pain in the upper right part of your belly. Pain might spread to your right shoulder, back, or chest. Your pain lasts more than 2 hours. You have been diagnosed with gallstones that have no symptoms and you get: Belly pain. Discomfort, burning, or fullness in the upper part of your abdomen. You keep feeling like you may vomit. You have dark pee or pale poop. Get help right away if: You  have pain in your abdomen, that: Lasts more than 5 hours. Keeps getting worse. You have a fever or chills. You can't stop vomiting. Your skin or the white parts of your eyes turn yellow. This information is not intended to replace advice given to you by your health care provider. Make sure you discuss any questions you have with your health care provider. Document Revised: 07/02/2022 Document Reviewed: 07/02/2022 Elsevier Patient Education  2024 [employer].

## 2024-01-31 NOTE — Progress Notes (Signed)
 01/31/2024  Reason for Visit:  Cholelithiasis and elevated LFTs  Requesting Provider:  Jolene Cannady, NP  History of Present Illness: Kayla Townsend is a 38 y.o. female presenting for evaluation of epigastric pain and elevated LFTs.  The patient saw her P CP on 01/09/24 with a 4 day history of epigastric abdominal pain that would happen about once per day, about 1 hour after eating, and would resolve on its own.  She was taking Pepto Bismol after each episode and after the 4th day of this, she called her PCP to be seen.  Patient started taking over the counter antiacid and she also had labwork done.  She reports that when starting the antiacid, her pain episodes resolved and she has not had any pain since.  Her labwork was significant for elevated LFTs, with AST 93, ALT 173, Alk Phos 126, and GGT 126.  Her tbili was normal at 0.4.  She had an ultrasound of the RUQ which showed cholelithiasis with multiple gallstones but no inflammatory changes.    She reports the pain episodes were in epigastric location and then would spread toward her bilateral upper quadrants.  Denies any nausea or emesis, any back pain, or any fevers/chills.  She has not had issues with her gallbladder in the past.  She has a 70 month old daughter and she only had heartburn issues during her pregnancy.  Past Medical History: Past Medical History:  Diagnosis Date   Abnormal Pap smear of vagina    Allergy    Anxiety    Cervical high risk human papillomavirus (HPV) DNA test positive 03/2018   Cholelithiases 03/2018   on renal u/s, no sx   Kidney stone    Miscarriage 06/2022     Past Surgical History: Past Surgical History:  Procedure Laterality Date   WISDOM TOOTH EXTRACTION      Home Medications: Prior to Admission medications   Medication Sig Start Date End Date Taking? Authorizing Provider  acetaminophen  (TYLENOL ) 325 MG tablet Take 3 tablets (975 mg total) by mouth every 6 (six) hours as needed for mild  pain, moderate pain or fever (for pain scale < 4). 07/07/23  Yes Teodora Fell, CNM  docusate sodium  (COLACE) 100 MG capsule Take 100 mg by mouth daily.   Yes [provider]  escitalopram (LEXAPRO) 10 MG tablet Take 10 mg by mouth daily. 12/25/23  Yes [provider]  famotidine (PEPCID) 10 MG tablet Take 10 mg by mouth 2 (two) times daily.   Yes [provider]  ferrous sulfate  325 (65 FE) MG EC tablet Take 1 tablet (325 mg total) by mouth daily with breakfast. 07/07/23 07/06/24 Yes Teodora Fell, CNM  ibuprofen  (ADVIL ) 600 MG tablet Take 1 tablet (600 mg total) by mouth every 6 (six) hours as needed for mild pain or cramping. 07/07/23  Yes Teodora Fell, CNM  norethindrone  (MICRONOR ) 0.35 MG tablet Take 1 tablet by mouth daily. 08/26/23 08/25/24 Yes [provider]  Prenatal Vit-Fe Fumarate-FA (PRENATAL MULTIVITAMIN) TABS tablet Take 1 tablet by mouth daily at 12 noon.   Yes [provider]    Allergies: Allergies  Allergen Reactions   Ceclor [Cefaclor] Swelling    As a child    Social History:  reports that she has never smoked. She has never been exposed to tobacco smoke. She has never used smokeless tobacco. She reports that she does not currently use alcohol. She reports that she does not use drugs.   Family  History: Family History  Problem Relation Age of Onset   Hyperlipidemia Mother    Anxiety disorder Mother    Stroke Father 31   Dementia Father    Diabetes Father    Bipolar disorder Father    Anxiety disorder Brother    Atrial fibrillation Maternal Grandmother    Diabetes Maternal Grandfather    Alzheimer's disease Maternal Grandfather    Alzheimer's disease Paternal Grandmother    Lung cancer Paternal Grandfather 36   Ovarian cancer Neg Hx     Review of Systems: Review of Systems  Constitutional:  Negative for chills and fever.  Respiratory:  Negative for shortness of breath.   Cardiovascular:  Negative for chest pain.   Gastrointestinal:  Positive for abdominal pain. Negative for nausea and vomiting.  Genitourinary:  Negative for dysuria.  Musculoskeletal:  Negative for back pain.    Physical Exam BP 106/72   Pulse 72   Ht 5\' 5"  (1.651 m)   Wt 175 lb (79.4 kg)   LMP 01/28/2024 (Exact Date)   SpO2 98%   Breastfeeding Yes   BMI 29.12 kg/m  CONSTITUTIONAL: No acute distress, well nourished. HEENT:  Normocephalic, atraumatic, extraocular motion intact. RESPIRATORY:  Lungs are clear, and breath sounds are equal bilaterally. Normal respiratory effort without pathologic use of accessory muscles. CARDIOVASCULAR: Heart is regular without murmurs, gallops, or rubs. GI: The abdomen is soft, non-distended, currently non-tender to palpation.  Negative Murphy's sign.  MUSCULOSKELETAL:  Normal muscle strength and tone in all four extremities.  No peripheral edema or cyanosis. NEUROLOGIC:  Motor and sensation is grossly normal.  Cranial nerves are grossly intact. PSYCH:  Alert and oriented to person, place and time. Affect is normal.  Laboratory Analysis: Labs from 01/09/24: Na 140, K 3.9, Cl 102, CO2 24, BUN 15, Cr 0.76.  Total bili 0.4, AST 93, ALT 173, Alk Phos 126, albumin 4.6, GGT 126.  WBC 6.4, Hgb 14.3, Hct 41.8, Plt 247.  Imaging: Ultrasound RUQ on 01/20/24: IMPRESSION: Cholelithiasis without sonographic evidence of acute cholecystitis.  Assessment and Plan: This is a 38 y.o. female with cholelithiasis and elevated LFTs  -- Discussed with the patient the findings on her labs and ultrasound showing cholelithiasis.  She had 4 days of daily episodes of epigastric pain after eating.  Discussed with her also that although her symptoms resolved after starting an OTC antiacid, her laboratory studies indicate issues more with the gallbladder/liver rather than her stomach.  Discussed with her how the gallbladder works, how stones can form, and how these can result in episodes of biliary colic.  It could be that  her stones were causing more of a blockage during those episodes compared to nowadays that she's not having any issue.  Discussed with her the typical treatment options ranging from a low fat diet to surgical management with cholecystectomy.  Discussed that cholecystectomy would be an outpatient surgery, and that it does have a restriction of no heavy lifting/pushing of no more than 15 lbs for 4 weeks. -- The patient would like to explore non-surgical options for now, but she reports that she would undergo surgery if needed.  For now, will give her information on gallbladder diet plan.  Discussed with her that it is unclear to me if the antiacid truly played a role in her symptoms.  She could technically discontinue this as a trial basis. -- Patient will follow up with me in 1 month to reassess her progress, or sooner if any issues.  If at  the 1 month time she's not having any issues, she could postpone or cancel the appointment. -- All of her questions have been answered.  I spent 30 minutes dedicated to the care of this patient on the date of this encounter to include pre-visit review of records, face-to-face time with the patient discussing diagnosis and management, and any post-visit coordination of care.   Marene Shape, MD [facility]

## 2024-02-10 ENCOUNTER — Telehealth: Admitting: Nurse Practitioner

## 2024-02-10 ENCOUNTER — Ambulatory Visit: Admitting: Nurse Practitioner

## 2024-02-10 ENCOUNTER — Encounter: Payer: Self-pay | Admitting: Nurse Practitioner

## 2024-02-10 DIAGNOSIS — J302 Other seasonal allergic rhinitis: Secondary | ICD-10-CM | POA: Diagnosis not present

## 2024-02-10 MED ORDER — FLUTICASONE PROPIONATE 50 MCG/ACT NA SUSP: 2.0000 | Freq: Every day | NASAL | 6 refills | Status: DC

## 2024-02-10 NOTE — Progress Notes (Signed)
 LMP 01/28/2024 (Exact Date)    Subjective:    Patient ID: Kayla Townsend, female    DOB: [birth year], 38 y.o.   MRN: [id number]  HPI: Kayla Townsend is a 38 y.o. female  Chief Complaint  Patient presents with   URI    Started 02/01/2024 Symptoms sore throat, constant cough morning feels wet and through out the day is more of a tickle. No sinus related concerns. No fevers.    UPPER RESPIRATORY TRACT INFECTION Worst symptom: started about 10 days ago.   Fever: no Cough: yes Shortness of breath: no Wheezing: no Chest pain: no Chest tightness: no Chest congestion: no Nasal congestion: no Runny nose: no Post nasal drip: no Sneezing: no Sore throat: yes Swollen glands: no Sinus pressure: no Headache: no Face pain: no Toothache: no Ear pain: no bilateral Ear pressure: no bilateral Eyes red/itching:no Eye drainage/crusting: no  Vomiting: no Rash: no Fatigue: yes Sick contacts: no Strep contacts: no  Context: stable Recurrent sinusitis: no Relief with OTC cold/cough medications: yes  Treatments attempted: mucinex    Relevant past medical, surgical, family and social history reviewed and updated as indicated. Interim medical history since our last visit reviewed. Allergies and medications reviewed and updated.  Review of Systems  Constitutional:  Positive for fatigue. Negative for fever.  HENT:  Positive for sore throat. Negative for congestion, dental problem, ear pain, postnasal drip, rhinorrhea, sinus pressure, sinus pain and sneezing.   Respiratory:  Positive for cough. Negative for shortness of breath and wheezing.   Cardiovascular:  Negative for chest pain.  Gastrointestinal:  Negative for vomiting.  Skin:  Negative for rash.  Neurological:  Negative for headaches.    Per HPI unless specifically indicated above     Objective:     LMP 01/28/2024 (Exact Date)   Wt Readings from Last 3 Encounters:  01/31/24 175 lb (79.4 kg)  01/09/24 177  lb 12.8 oz (80.6 kg)  07/02/23 206 lb (93.4 kg)    Physical Exam Vitals and nursing note reviewed.  HENT:     Head: Normocephalic.     Right Ear: Hearing normal.     Left Ear: Hearing normal.     Nose: Nose normal.  Eyes:     Pupils: Pupils are equal, round, and reactive to light.  Pulmonary:     Effort: Pulmonary effort is normal. No respiratory distress.  Neurological:     Mental Status: She is alert.  Psychiatric:        Mood and Affect: Mood normal.        Behavior: Behavior normal.        Thought Content: Thought content normal.        Judgment: Judgment normal.     Results for orders placed or performed in visit on 01/09/24  Comprehensive metabolic panel with GFR   Collection Time: 01/09/24 11:49 AM  Result Value Ref Range   Glucose 85 70 - 99 mg/dL   BUN 15 6 - 20 mg/dL   Creatinine, Ser 9.52 0.57 - 1.00 mg/dL   eGFR 841 >32 GM/WNU/2.72   BUN/Creatinine Ratio 20 9 - 23   Sodium 140 134 - 144 mmol/L   Potassium 3.9 3.5 - 5.2 mmol/L   Chloride 102 96 - 106 mmol/L   CO2 24 20 - 29 mmol/L   Calcium  9.7 8.7 - 10.2 mg/dL   Total Protein 6.8 6.0 - 8.5 g/dL   Albumin 4.6 3.9 - 4.9 g/dL   Globulin, Total 2.2 1.5 -  4.5 g/dL   Bilirubin Total 0.4 0.0 - 1.2 mg/dL   Alkaline Phosphatase 126 (H) 44 - 121 IU/L   AST 93 (H) 0 - 40 IU/L   ALT 173 (H) 0 - 32 IU/L  CBC with Differential/Platelet   Collection Time: 01/09/24 11:49 AM  Result Value Ref Range   WBC 6.4 3.4 - 10.8 x10E3/uL   RBC 4.55 3.77 - 5.28 x10E6/uL   Hemoglobin 14.3 11.1 - 15.9 g/dL   Hematocrit 52.8 41.3 - 46.6 %   MCV 92 79 - 97 fL   MCH 31.4 26.6 - 33.0 pg   MCHC 34.2 31.5 - 35.7 g/dL   RDW 24.4 01.0 - 27.2 %   Platelets 247 150 - 450 x10E3/uL   Neutrophils 61 Not Estab. %   Lymphs 27 Not Estab. %   Monocytes 8 Not Estab. %   Eos 3 Not Estab. %   Basos 1 Not Estab. %   Neutrophils Absolute 3.9 1.4 - 7.0 x10E3/uL   Lymphocytes Absolute 1.7 0.7 - 3.1 x10E3/uL   Monocytes Absolute 0.5 0.1 - 0.9  x10E3/uL   EOS (ABSOLUTE) 0.2 0.0 - 0.4 x10E3/uL   Basophils Absolute 0.0 0.0 - 0.2 x10E3/uL   Immature Granulocytes 0 Not Estab. %   Immature Grans (Abs) 0.0 0.0 - 0.1 x10E3/uL  Gamma GT   Collection Time: 01/09/24 11:49 AM  Result Value Ref Range   GGT 126 (H) 0 - 60 IU/L      Assessment & Plan:   Problem List Items Addressed This Visit   None    Follow up plan: No follow-ups on file.   This visit was completed via MyChart due to the restrictions of the COVID-19 pandemic. All issues as above were discussed and addressed. Physical exam was done as above through visual confirmation on MyChart. If it was felt that the patient should be evaluated in the office, they were directed there. The patient verbally consented to this visit. Location of the patient: Home Location of the provider: Office Those involved with this call:  Provider: Aileen Alexanders, NP CMA: Althia Jetty, CMA Front Desk/Registration: Jaynee Meyer This encounter was conducted via video.  I spent 20 mins dedicated to the care of this patient on the date of this encounter to include previsit review of symptoms, plan of care and follow up, face to face time with the patient, and post visit ordering of testing.

## 2024-02-23 NOTE — Patient Instructions (Incomplete)
 GERD in Adults: Diet Changes When you have gastroesophageal reflux disease (GERD), you may need to make changes to your diet. Choosing the right foods can help with your symptoms. Think about working with an expert in healthy eating called a dietitian. They can help you make healthy food choices. What are tips for following this plan? Reading food labels Look for foods that are low in saturated fat. Foods that may help with your symptoms include: Foods with less than 5% of daily value (DV) of fat. Foods with 0 grams of trans fat. Cooking [employer] in ways that don't use a lot of fat. These ways include: Baking. Steaming. Grilling. Broiling. To add flavor, try to use herbs that are low in spice and acidity. Avoid frying your food. Meal planning  Eat small meals often rather than eating 3 large meals each day. Eat your meals slowly in a place where you feel relaxed. If told by your health care provider, avoid: Foods that cause symptoms. Keep a food diary to keep track of foods that cause symptoms. Alcohol. Drinking a lot of liquid with meals. General instructions For 2-3 hours after you eat, avoid: Bending over. Exercise. Lying down. Chew sugar-free gum after meals. What foods should I eat? Eat a healthy diet. Try to include: Foods with high amounts of fiber. These include: Fruits and vegetables. Whole grains and beans. Low-fat dairy products. Lean meats, fish, and poultry. Egg whites. Foods that cause symptoms in someone else may not cause symptoms for you. Work with your provider to find foods that are safe for you. The items listed above may not be all the foods and drinks you can have. Talk with a dietitian to learn more. The items listed above may not be a complete list of foods and beverages you can eat and drink. Contact a dietitian for more information. What foods should I avoid? Limiting some of these foods may help with your symptoms. Each person is different.  Talk with a dietitian or your provider to help you find the exact foods to avoid. Some of the foods to avoid may include: Fruits Fruits with a lot of acid in them. These may include citrus fruits, such as oranges, grapefruit, pineapple, and lemons. Vegetables Deep-fried vegetables, such as [place] fries. Vegetables, sauces, or toppings made with added fat and vegetables with acid in them. These may include tomatoes and tomato products, chili peppers, onions, garlic, and horseradish. Grains Pastries or quick breads with added fat. Meats and other proteins High-fat meats, such as fatty beef or pork, hot dogs, ribs, ham, sausage, salami, and bacon. Fried meat or protein, such as fried fish and fried chicken. Egg yolks. Fats and oils Butter. Margarine. Shortening. Ghee. Drinks Coffee and other drinks with caffeine in them. Fizzy and sugary drinks, such as soda and energy drinks. Fruit juice made with acidic fruits, such as orange or grapefruit. Tomato juice. Sweets and desserts Chocolate and cocoa. Donuts. Seasonings and condiments Mint, such as peppermint and spearmint. Condiments, herbs, or seasonings that cause symptoms. These may include curry, hot sauce, or vinegar-based salad dressings. The items listed above may not be all the foods and drinks you should avoid. Talk with a dietitian to learn more. Questions to ask your health care provider Changes to your diet and everyday life are often the first steps taken to manage symptoms of GERD. If these changes don't help, talk with your provider about taking medicines. Where to find more information [employer]:  aboutgerd.org This information is not intended to replace advice given to you by your health care provider. Make sure you discuss any questions you have with your health care provider. Document Revised: 07/30/2023 Document Reviewed: 02/13/2023 Elsevier Patient Education  2024 [employer].

## 2024-02-27 ENCOUNTER — Ambulatory Visit: Admitting: Nurse Practitioner

## 2024-03-06 ENCOUNTER — Ambulatory Visit: Admitting: Surgery

## 2024-06-06 DIAGNOSIS — E7800 Pure hypercholesterolemia, unspecified: Secondary | ICD-10-CM | POA: Insufficient documentation

## 2024-06-06 NOTE — Patient Instructions (Signed)
 Be Involved in Caring For Your Health:  Taking Medications When medications are taken as directed, they can greatly improve your health. But if they are not taken as prescribed, they may not work. In some cases, not taking them correctly can be harmful. To help ensure your treatment remains effective and safe, understand your medications and how to take them. Bring your medications to each visit for review by your provider.  Your lab results, notes, and after visit summary will be available on My Chart. We strongly encourage you to use this feature. If lab results are abnormal the clinic will contact you with the appropriate steps. If the clinic does not contact you assume the results are satisfactory. You can always view your results on My Chart. If you have questions regarding your health or results, please contact the clinic during office hours. You can also ask questions on My Chart.  We at [facility] are grateful that you chose us  to provide your care. We strive to provide evidence-based and compassionate care and are always looking for feedback. If you get a survey from the clinic please complete this so we can hear your opinions.  Healthy Eating, Adult Healthy eating may help you get and keep a healthy body weight, reduce the risk of chronic disease, and live a long and productive life. It is important to follow a healthy eating pattern. Your nutritional and calorie needs should be met mainly by different nutrient-rich foods. What are tips for following this plan? Reading food labels Read labels and choose the following: Reduced or low sodium products. Juices with 100% fruit juice. Foods with low saturated fats (<3 g per serving) and high polyunsaturated and monounsaturated fats. Foods with whole grains, such as whole wheat, cracked wheat, brown rice, and wild rice. Whole grains that are fortified with folic acid. This is recommended for females who are pregnant or who want to  become pregnant. Read labels and do not eat or drink the following: Foods or drinks with added sugars. These include foods that contain brown sugar, corn sweetener, corn syrup, dextrose , fructose, glucose, high-fructose corn syrup, honey, invert sugar, lactose, malt syrup, maltose, molasses, raw sugar, sucrose, trehalose, or turbinado sugar. Limit your intake of added sugars to less than 10% of your total daily calories. Do not eat more than the following amounts of added sugar per day: 6 teaspoons (25 g) for females. 9 teaspoons (38 g) for males. Foods that contain processed or refined starches and grains. Refined grain products, such as white flour, degermed cornmeal, white bread, and white rice. Shopping Choose nutrient-rich snacks, such as vegetables, whole fruits, and nuts. Avoid high-calorie and high-sugar snacks, such as potato chips, fruit snacks, and candy. Use oil-based dressings and spreads on foods instead of solid fats such as butter, margarine, sour cream, or cream cheese. Limit pre-made sauces, mixes, and instant products such as flavored rice, instant noodles, and ready-made pasta. Try more plant-protein sources, such as tofu, tempeh, black beans, edamame, lentils, nuts, and seeds. Explore eating plans such as the Mediterranean diet or vegetarian diet. Try heart-healthy dips made with beans and healthy fats like hummus and guacamole. Vegetables go great with these. Cooking Use oil to saut or stir-fry foods instead of solid fats such as butter, margarine, or lard. Try baking, boiling, grilling, or broiling instead of frying. Remove the fatty part of meats before cooking. Steam vegetables in water  or broth. Meal planning  At meals, imagine dividing your plate into fourths: One-half of  your plate is fruits and vegetables. One-fourth of your plate is whole grains. One-fourth of your plate is protein, especially lean meats, poultry, eggs, tofu, beans, or nuts. Include low-fat  dairy as part of your daily diet. Lifestyle Choose healthy options in all settings, including home, work, school, restaurants, or stores. Prepare your food safely: Wash your hands after handling raw meats. Where you prepare food, keep surfaces clean by regularly washing with hot, soapy water . Keep raw meats separate from ready-to-eat foods, such as fruits and vegetables. Cook seafood, meat, poultry, and eggs to the recommended temperature. Get a food thermometer. Store foods at safe temperatures. In general: Keep cold foods at 84F (4.4C) or below. Keep hot foods at 184F (60C) or above. Keep your freezer at [facility] (-17.8C) or below. Foods are not safe to eat if they have been between the temperatures of 40-184F (4.4-60C) for more than 2 hours. What foods should I eat? Fruits Aim to eat 1-2 cups of fresh, canned (in natural juice), or frozen fruits each day. One cup of fruit equals 1 small apple, 1 large banana, 8 large strawberries, 1 cup (237 g) canned fruit,  cup (82 g) dried fruit, or 1 cup (240 mL) 100% juice. Vegetables Aim to eat 2-4 cups of fresh and frozen vegetables each day, including different varieties and colors. One cup of vegetables equals 1 cup (91 g) broccoli or cauliflower florets, 2 medium carrots, 2 cups (150 g) raw, leafy greens, 1 large tomato, 1 large bell pepper, 1 large sweet potato, or 1 medium white potato. Grains Aim to eat 5-10 ounce-equivalents of whole grains each day. Examples of 1 ounce-equivalent of grains include 1 slice of bread, 1 cup (40 g) ready-to-eat cereal, 3 cups (24 g) popcorn, or  cup (93 g) cooked rice. Meats and other proteins Try to eat 5-7 ounce-equivalents of protein each day. Examples of 1 ounce-equivalent of protein include 1 egg,  oz nuts (12 almonds, 24 pistachios, or 7 walnut halves), 1/4 cup (90 g) cooked beans, 6 tablespoons (90 g) hummus or 1 tablespoon (16 g) peanut butter. A cut of meat or fish that is the size of a deck of  cards is about 3-4 ounce-equivalents (85 g). Of the protein you eat each week, try to have at least 8 sounce (227 g) of seafood. This is about 2 servings per week. This includes salmon, trout, herring, sardines, and anchovies. Dairy Aim to eat 3 cup-equivalents of fat-free or low-fat dairy each day. Examples of 1 cup-equivalent of dairy include 1 cup (240 mL) milk, 8 ounces (250 g) yogurt, 1 ounces (44 g) natural cheese, or 1 cup (240 mL) fortified soy milk. Fats and oils Aim for about 5 teaspoons (21 g) of fats and oils per day. Choose monounsaturated fats, such as canola and olive oils, mayonnaise made with olive oil or avocado oil, avocados, peanut butter, and most nuts, or polyunsaturated fats, such as sunflower, corn, and soybean oils, walnuts, pine nuts, sesame seeds, sunflower seeds, and flaxseed. Beverages Aim for 6 eight-ounce glasses of water  per day. Limit coffee to 3-5 eight-ounce cups per day. Limit caffeinated beverages that have added calories, such as soda and energy drinks. If you drink alcohol: Limit how much you have to: 0-1 drink a day if you are female. 0-2 drinks a day if you are female. Know how much alcohol is in your drink. In the [place] one drink is one 12 oz bottle of beer (355 mL), one 5 oz glass of wine (  148 mL), or one 1 oz glass of hard liquor (44 mL). Seasoning and other foods Try not to add too much salt to your food. Try using herbs and spices instead of salt. Try not to add sugar to food. This information is based on [place] nutrition guidelines. To learn more, visit [employer]. Exact amounts may vary. You may need different amounts. This information is not intended to replace advice given to you by your health care provider. Make sure you discuss any questions you have with your health care provider. Document Revised: 06/18/2022 Document Reviewed: 06/18/2022 Elsevier Patient Education  2024 [employer].

## 2024-06-08 ENCOUNTER — Encounter: Payer: Self-pay | Admitting: Nurse Practitioner

## 2024-06-08 ENCOUNTER — Ambulatory Visit (INDEPENDENT_AMBULATORY_CARE_PROVIDER_SITE_OTHER): Payer: Self-pay | Admitting: Nurse Practitioner

## 2024-06-08 VITALS — BP 116/74 | HR 66 | Temp 98.7°F | Resp 15 | Ht 65.0 in | Wt 169.9 lb

## 2024-06-08 DIAGNOSIS — K8020 Calculus of gallbladder without cholecystitis without obstruction: Secondary | ICD-10-CM

## 2024-06-08 DIAGNOSIS — Z0000 Encounter for general adult medical examination without abnormal findings: Secondary | ICD-10-CM | POA: Diagnosis not present

## 2024-06-08 DIAGNOSIS — E7800 Pure hypercholesterolemia, unspecified: Secondary | ICD-10-CM | POA: Diagnosis not present

## 2024-06-08 DIAGNOSIS — F411 Generalized anxiety disorder: Secondary | ICD-10-CM | POA: Diagnosis not present

## 2024-06-08 NOTE — Assessment & Plan Note (Signed)
 Stable at present, focusing on diet.  Continue to monitor and get back into general surgery as needed.

## 2024-06-08 NOTE — Progress Notes (Signed)
 BP 116/74 (BP Location: Left Arm, Patient Position: Sitting, Cuff Size: Normal)   Pulse 66   Temp 98.7 F (37.1 C) (Oral)   Resp 15   Ht 5' 5 (1.651 m)   Wt 169 lb 14.4 oz (77.1 kg)   LMP 06/01/2024 (Exact Date)   SpO2 98%   Breastfeeding Yes   BMI 28.27 kg/m    Subjective:    Patient ID: Kayla Townsend, female    DOB: 09-24-1986, 38 y.o.   MRN: [id number]  HPI: Kayla Townsend is a 38 y.o. female presenting on 06/08/2024 for comprehensive medical examination. Current medical complaints include:none  She currently lives with: significant other Menopausal Symptoms: no  Has known gallstones, is working on diet.  No surgery.  ANXIETY Taking Lexapro with benefit. Mood status: stable Satisfied with current treatment?: yes Symptom severity: mild  Duration of current treatment : chronic Side effects: no Medication compliance: good compliance Depressed mood: very rare Anxious mood: no Anhedonia: no Significant weight loss or gain: no Insomnia: none Fatigue: no Feelings of worthlessness or guilt: no Impaired concentration/indecisiveness: no Suicidal ideations: no Hopelessness: no Crying spells: no    06/08/2024    1:37 PM 02/10/2024    2:32 PM 01/09/2024   11:29 AM 06/07/2023    9:40 AM 06/05/2022    8:50 AM  Depression screen PHQ 2/9  Decreased Interest 0 0 0 0 0  Down, Depressed, Hopeless 1 0 0 1 0  PHQ - 2 Score 1 0 0 1 0  Altered sleeping 0  0 1 0  Tired, decreased energy 0  0 1 0  Change in appetite 0  0 0 0  Feeling bad or failure about yourself  0  0 0 0  Trouble concentrating 0  0 0 0  Moving slowly or fidgety/restless 0  0 0 0  Suicidal thoughts 0  0 0 0  PHQ-9 Score 1  0 3 0  Difficult doing work/chores Not difficult at all  Not difficult at all Not difficult at all Not difficult at all       06/08/2024    1:37 PM 02/10/2024    2:32 PM 01/09/2024   11:30 AM 06/07/2023    9:41 AM  GAD 7 : Generalized Anxiety Score  Nervous, Anxious, on Edge 0  0 0 0  Control/stop worrying 0 0 0 0  Worry too much - different things 0  0 0  Trouble relaxing 0  0 0  Restless 0  0 0  Easily annoyed or irritable 1  0 0  Afraid - awful might happen 0  0 0  Total GAD 7 Score 1  0 0  Anxiety Difficulty Not difficult at all  Not difficult at all Not difficult at all      06/07/2023    9:40 AM 01/09/2024   11:29 AM 02/10/2024    2:32 PM 06/08/2024    1:12 PM 06/08/2024    1:37 PM  Fall Risk  Falls in the past year? 0 0 0 0 0  Was there an injury with Fall? 0 0 0 0 0  Fall Risk Category Calculator 0 0 0 0 0  Patient at Risk for Falls Due to No Fall Risks No Fall Risks No Fall Risks No Fall Risks No Fall Risks  Fall risk Follow up Falls evaluation completed Falls evaluation completed Falls evaluation completed Falls evaluation completed Falls evaluation completed    Functional Status Survey: Is the patient deaf or  have difficulty hearing?: No Does the patient have difficulty seeing, even when wearing glasses/contacts?: No Does the patient have difficulty concentrating, remembering, or making decisions?: No Does the patient have difficulty walking or climbing stairs?: No Does the patient have difficulty dressing or bathing?: No Does the patient have difficulty doing errands alone such as visiting a doctor's office or shopping?: No    Past Medical History:  Past Medical History:  Diagnosis Date   Abnormal Pap smear of vagina    Allergy    Anxiety    Cervical high risk human papillomavirus (HPV) DNA test positive 03/2018   Cholelithiases 03/2018   on renal u/s, no sx   Kidney stone    Miscarriage 06/2022    Surgical History:  Past Surgical History:  Procedure Laterality Date   WISDOM TOOTH EXTRACTION      Medications:  Current Outpatient Medications on File Prior to Visit  Medication Sig   acetaminophen  (TYLENOL ) 325 MG tablet Take 3 tablets (975 mg total) by mouth every 6 (six) hours as needed for mild pain, moderate pain or fever (for  pain scale < 4).   docusate sodium  (COLACE) 100 MG capsule Take 100 mg by mouth daily.   escitalopram (LEXAPRO) 10 MG tablet Take 10 mg by mouth daily.   ferrous sulfate  325 (65 FE) MG EC tablet Take 1 tablet (325 mg total) by mouth daily with breakfast.   fluticasone  (FLONASE ) 50 MCG/ACT nasal spray Place 2 sprays into both nostrils daily.   ibuprofen  (ADVIL ) 600 MG tablet Take 1 tablet (600 mg total) by mouth every 6 (six) hours as needed for mild pain or cramping.   norethindrone  (MICRONOR ) 0.35 MG tablet Take 1 tablet by mouth daily.   Prenatal Vit-Fe Fumarate-FA (PRENATAL MULTIVITAMIN) TABS tablet Take 1 tablet by mouth daily at 12 noon.   No current facility-administered medications on file prior to visit.    Allergies:  Allergies  Allergen Reactions   Ceclor [Cefaclor] Swelling    As a child    Social History:  Social History   Socioeconomic History   Marital status: Married    Spouse name: Ben   Number of children: 0   Years of education: 18   Highest education level: Master's degree (e.g., MA, MS, MEng, MEd, MSW, MBA)  Occupational History   Occupation: nonprofit - [employer]  Tobacco Use   Smoking status: Never    Passive exposure: Never   Smokeless tobacco: Never  Vaping Use   Vaping status: Never Used  Substance and Sexual Activity   Alcohol use: Not Currently    Comment: occasional   Drug use: No   Sexual activity: Yes    Partners: Male    Birth control/protection: None    Comment: Currently pregnant  Other Topics Concern   Not on file  Social History Narrative   Not on file   Social Drivers of Health   Financial Resource Strain: Low Risk  (02/09/2024)   Overall Financial Resource Strain (CARDIA)    Difficulty of Paying Living Expenses: Not very hard  Food Insecurity: No Food Insecurity (06/08/2024)   Hunger Vital Sign    Worried About Running Out of Food in the Last Year: Never true    Ran Out of Food in the Last Year: Never true   Transportation Needs: No Transportation Needs (02/09/2024)   PRAPARE - Administrator, Civil Service (Medical): No    Lack of Transportation (Non-Medical): No  Physical Activity: Insufficiently Active (  02/09/2024)   Exercise Vital Sign    Days of Exercise per Week: 5 days    Minutes of Exercise per Session: 20 min  Stress: No Stress Concern Present (02/09/2024)   [employer] of Occupational Health - Occupational Stress Questionnaire    Feeling of Stress : Not at all  Social Connections: Moderately Integrated (02/09/2024)   Social Connection and Isolation Panel    Frequency of Communication with Friends and Family: More than three times a week    Frequency of Social Gatherings with Friends and Family: More than three times a week    Attends Religious Services: More than 4 times per year    Active Member of [employer] or Organizations: No    Attends Engineer, structural: Not on file    Marital Status: Married  Catering manager Violence: Not At Risk (06/08/2024)   Humiliation, Afraid, Rape, and Kick questionnaire    Fear of Current or Ex-Partner: No    Emotionally Abused: No    Physically Abused: No    Sexually Abused: No   Social History   Tobacco Use  Smoking Status Never   Passive exposure: Never  Smokeless Tobacco Never   Social History   Substance and Sexual Activity  Alcohol Use Not Currently   Comment: occasional    Family History:  Family History  Problem Relation Age of Onset   Hyperlipidemia Mother    Anxiety disorder Mother    Stroke Father 34   Dementia Father    Diabetes Father    Bipolar disorder Father    Anxiety disorder Brother    Atrial fibrillation Maternal Grandmother    Diabetes Maternal Grandfather    Alzheimer's disease Maternal Grandfather    Alzheimer's disease Paternal Grandmother    Lung cancer Paternal Grandfather 44   Ovarian cancer Neg Hx     Past medical history, surgical history, medications, allergies, family  history and social history reviewed with patient today and changes made to appropriate areas of the chart.   ROS All other ROS negative except what is listed above and in the HPI.      Objective:    BP 116/74 (BP Location: Left Arm, Patient Position: Sitting, Cuff Size: Normal)   Pulse 66   Temp 98.7 F (37.1 C) (Oral)   Resp 15   Ht 5' 5 (1.651 m)   Wt 169 lb 14.4 oz (77.1 kg)   LMP 06/01/2024 (Exact Date)   SpO2 98%   Breastfeeding Yes   BMI 28.27 kg/m   Wt Readings from Last 3 Encounters:  06/08/24 169 lb 14.4 oz (77.1 kg)  01/31/24 175 lb (79.4 kg)  01/09/24 177 lb 12.8 oz (80.6 kg)    Physical Exam Vitals and nursing note reviewed. Exam conducted with a chaperone present.  Constitutional:      General: She is awake. She is not in acute distress.    Appearance: She is well-developed and well-groomed. She is not ill-appearing or toxic-appearing.  HENT:     Head: Normocephalic and atraumatic.     Right Ear: Hearing, tympanic membrane, ear canal and external ear normal. No drainage.     Left Ear: Hearing, tympanic membrane, ear canal and external ear normal. No drainage.     Nose: Nose normal.     Right Sinus: No maxillary sinus tenderness or frontal sinus tenderness.     Left Sinus: No maxillary sinus tenderness or frontal sinus tenderness.     Mouth/Throat:     Mouth: Mucous  membranes are moist.     Pharynx: Oropharynx is clear. Uvula midline. No pharyngeal swelling, oropharyngeal exudate or posterior oropharyngeal erythema.  Eyes:     General: Lids are normal.        Right eye: No discharge.        Left eye: No discharge.     Extraocular Movements: Extraocular movements intact.     Conjunctiva/sclera: Conjunctivae normal.     Pupils: Pupils are equal, round, and reactive to light.     Visual Fields: Right eye visual fields normal and left eye visual fields normal.  Neck:     Thyroid : No thyromegaly.     Vascular: No carotid bruit.     Trachea: Trachea normal.   Cardiovascular:     Rate and Rhythm: Normal rate and regular rhythm.     Heart sounds: Normal heart sounds. No murmur heard.    No gallop.  Pulmonary:     Effort: Pulmonary effort is normal. No accessory muscle usage or respiratory distress.     Breath sounds: Normal breath sounds.  Chest:  Breasts:    Right: Normal.     Left: Normal.  Abdominal:     General: Bowel sounds are normal.     Palpations: Abdomen is soft. There is no hepatomegaly or splenomegaly.     Tenderness: There is no abdominal tenderness.  Musculoskeletal:        General: Normal range of motion.     Cervical back: Normal range of motion and neck supple.     Right lower leg: No edema.     Left lower leg: No edema.  Lymphadenopathy:     Head:     Right side of head: No submental, submandibular, tonsillar, preauricular or posterior auricular adenopathy.     Left side of head: No submental, submandibular, tonsillar, preauricular or posterior auricular adenopathy.     Cervical: No cervical adenopathy.     Upper Body:     Right upper body: No supraclavicular, axillary or pectoral adenopathy.     Left upper body: No supraclavicular, axillary or pectoral adenopathy.  Skin:    General: Skin is warm and dry.     Capillary Refill: Capillary refill takes less than 2 seconds.     Findings: No rash.  Neurological:     Mental Status: She is alert and oriented to person, place, and time.     Gait: Gait is intact.     Deep Tendon Reflexes: Reflexes are normal and symmetric.     Reflex Scores:      Brachioradialis reflexes are 2+ on the right side and 2+ on the left side.      Patellar reflexes are 2+ on the right side and 2+ on the left side. Psychiatric:        Attention and Perception: Attention normal.        Mood and Affect: Mood normal.        Speech: Speech normal.        Behavior: Behavior normal. Behavior is cooperative.        Thought Content: Thought content normal.        Judgment: Judgment normal.     Results for orders placed or performed in visit on 01/09/24  Comprehensive metabolic panel with GFR   Collection Time: 01/09/24 11:49 AM  Result Value Ref Range   Glucose 85 70 - 99 mg/dL   BUN 15 6 - 20 mg/dL   Creatinine, Ser 9.23 0.57 - 1.00 mg/dL  eGFR 104 >59 mL/min/1.73   BUN/Creatinine Ratio 20 9 - 23   Sodium 140 134 - 144 mmol/L   Potassium 3.9 3.5 - 5.2 mmol/L   Chloride 102 96 - 106 mmol/L   CO2 24 20 - 29 mmol/L   Calcium  9.7 8.7 - 10.2 mg/dL   Total Protein 6.8 6.0 - 8.5 g/dL   Albumin 4.6 3.9 - 4.9 g/dL   Globulin, Total 2.2 1.5 - 4.5 g/dL   Bilirubin Total 0.4 0.0 - 1.2 mg/dL   Alkaline Phosphatase 126 (H) 44 - 121 IU/L   AST 93 (H) 0 - 40 IU/L   ALT 173 (H) 0 - 32 IU/L  CBC with Differential/Platelet   Collection Time: 01/09/24 11:49 AM  Result Value Ref Range   WBC 6.4 3.4 - 10.8 x10E3/uL   RBC 4.55 3.77 - 5.28 x10E6/uL   Hemoglobin 14.3 11.1 - 15.9 g/dL   Hematocrit 58.1 65.9 - 46.6 %   MCV 92 79 - 97 fL   MCH 31.4 26.6 - 33.0 pg   MCHC 34.2 31.5 - 35.7 g/dL   RDW 87.2 88.2 - 84.5 %   Platelets 247 150 - 450 x10E3/uL   Neutrophils 61 Not Estab. %   Lymphs 27 Not Estab. %   Monocytes 8 Not Estab. %   Eos 3 Not Estab. %   Basos 1 Not Estab. %   Neutrophils Absolute 3.9 1.4 - 7.0 x10E3/uL   Lymphocytes Absolute 1.7 0.7 - 3.1 x10E3/uL   Monocytes Absolute 0.5 0.1 - 0.9 x10E3/uL   EOS (ABSOLUTE) 0.2 0.0 - 0.4 x10E3/uL   Basophils Absolute 0.0 0.0 - 0.2 x10E3/uL   Immature Granulocytes 0 Not Estab. %   Immature Grans (Abs) 0.0 0.0 - 0.1 x10E3/uL  Gamma GT   Collection Time: 01/09/24 11:49 AM  Result Value Ref Range   GGT 126 (H) 0 - 60 IU/L      Assessment & Plan:   Problem List Items Addressed This Visit       Digestive   Calculus of gallbladder without cholecystitis without obstruction   Stable at present, focusing on diet.  Continue to monitor and get back into general surgery as needed.      Relevant Orders   CBC with  Differential/Platelet   Comprehensive metabolic panel with GFR     Other   Generalized anxiety disorder   Chronic, stable at present.  Continue current medication regimen and adjust as needed.  Denies SI/HI.      Relevant Orders   TSH   Elevated low density lipoprotein (LDL) cholesterol level - Primary   Noted on past labs. Continue focus on healthy diet and regular exercise.  Labs today.      Relevant Orders   Comprehensive metabolic panel with GFR   Lipid Panel w/o Chol/HDL Ratio   Other Visit Diagnoses       Encounter for annual physical exam       Annual physical today with labs and health maintenance reviewed, discussed with patient.        Follow up plan: Return in about 1 year (around 06/08/2025) for Annual Physical.   LABORATORY TESTING:  - Pap smear: up to date  IMMUNIZATIONS:   - Tdap: Tetanus vaccination status reviewed: last tetanus booster within 10 years. - Influenza: Refused - Pneumovax: Not applicable - Prevnar: Not applicable - COVID: Up to date - HPV: Not applicable - Shingrix vaccine: Not applicable  SCREENING: -Mammogram: Not applicable  - Colonoscopy: Not applicable  -  Bone Density: Not applicable  -Hearing Test: Not applicable  -Spirometry: Not applicable   PATIENT COUNSELING:   Advised to take 1 mg of folate supplement per day if capable of pregnancy.   Sexuality: Discussed sexually transmitted diseases, partner selection, use of condoms, avoidance of unintended pregnancy  and contraceptive alternatives.   Advised to avoid cigarette smoking.  I discussed with the patient that most people either abstain from alcohol or drink within safe limits (<=14/week and <=4 drinks/occasion for males, <=7/weeks and <= 3 drinks/occasion for females) and that the risk for alcohol disorders and other health effects rises proportionally with the number of drinks per week and how often a drinker exceeds daily limits.  Discussed cessation/primary prevention  of drug use and availability of treatment for abuse.   Diet: Encouraged to adjust caloric intake to maintain  or achieve ideal body weight, to reduce intake of dietary saturated fat and total fat, to limit sodium intake by avoiding high sodium foods and not adding table salt, and to maintain adequate dietary potassium and calcium  preferably from fresh fruits, vegetables, and low-fat dairy products.    Stressed the importance of regular exercise  Injury prevention: Discussed safety belts, safety helmets, smoke detector, smoking near bedding or upholstery.   Dental health: Discussed importance of regular tooth brushing, flossing, and dental visits.    NEXT PREVENTATIVE PHYSICAL DUE IN 1 YEAR. Return in about 1 year (around 06/08/2025) for Annual Physical.

## 2024-06-08 NOTE — Assessment & Plan Note (Signed)
 Noted on past labs. Continue focus on healthy diet and regular exercise.  Labs today.

## 2024-06-08 NOTE — Assessment & Plan Note (Signed)
 Chronic, stable at present.  Continue current medication regimen and adjust as needed.  Denies SI/HI.

## 2024-06-09 ENCOUNTER — Ambulatory Visit: Payer: Self-pay | Admitting: Nurse Practitioner

## 2024-06-09 LAB — COMPREHENSIVE METABOLIC PANEL WITH GFR
ALT: 15 IU/L (ref 0–32)
AST: 21 IU/L (ref 0–40)
Albumin: 4.3 g/dL (ref 3.9–4.9)
Alkaline Phosphatase: 93 IU/L (ref 44–121)
BUN/Creatinine Ratio: 21 (ref 9–23)
BUN: 15 mg/dL (ref 6–20)
Bilirubin Total: 0.4 mg/dL (ref 0.0–1.2)
CO2: 24 mmol/L (ref 20–29)
Calcium: 9.2 mg/dL (ref 8.7–10.2)
Chloride: 103 mmol/L (ref 96–106)
Creatinine, Ser: 0.71 mg/dL (ref 0.57–1.00)
Globulin, Total: 2.8 g/dL (ref 1.5–4.5)
Glucose: 104 mg/dL — ABNORMAL HIGH (ref 70–99)
Potassium: 3.6 mmol/L (ref 3.5–5.2)
Sodium: 141 mmol/L (ref 134–144)
Total Protein: 7.1 g/dL (ref 6.0–8.5)
eGFR: 113 mL/min/1.73 (ref >59)

## 2024-06-09 LAB — TSH: TSH: 0.86 u[IU]/mL (ref 0.450–4.500)

## 2024-06-09 LAB — CBC WITH DIFFERENTIAL/PLATELET
Basophils Absolute: 0 x10E3/uL (ref 0.0–0.2)
Basos: 1 %
EOS (ABSOLUTE): 0.2 x10E3/uL (ref 0.0–0.4)
Eos: 2 %
Hematocrit: 42.5 % (ref 34.0–46.6)
Hemoglobin: 14.4 g/dL (ref 11.1–15.9)
Immature Grans (Abs): 0 x10E3/uL (ref 0.0–0.1)
Immature Granulocytes: 0 %
Lymphocytes Absolute: 2.2 x10E3/uL (ref 0.7–3.1)
Lymphs: 29 %
MCH: 32.1 pg (ref 26.6–33.0)
MCHC: 33.9 g/dL (ref 31.5–35.7)
MCV: 95 fL (ref 79–97)
Monocytes Absolute: 0.6 x10E3/uL (ref 0.1–0.9)
Monocytes: 8 %
Neutrophils Absolute: 4.6 x10E3/uL (ref 1.4–7.0)
Neutrophils: 60 %
Platelets: 237 x10E3/uL (ref 150–450)
RBC: 4.48 x10E6/uL (ref 3.77–5.28)
RDW: 12.5 % (ref 11.7–15.4)
WBC: 7.5 x10E3/uL (ref 3.4–10.8)

## 2024-06-09 LAB — LIPID PANEL W/O CHOL/HDL RATIO
Cholesterol, Total: 159 mg/dL (ref 100–199)
HDL: 69 mg/dL (ref >39)
LDL Chol Calc (NIH): 76 mg/dL (ref 0–99)
Triglycerides: 74 mg/dL (ref 0–149)
VLDL Cholesterol Cal: 14 mg/dL (ref 5–40)

## 2024-06-09 NOTE — Progress Notes (Signed)
 Contacted via MyChart  Good afternoon Breelyn, your labs have returned and are overall stable.  No changes needed. Great job!! Any questions? Keep being amazing!!  Thank you for allowing me to participate in your care.  I appreciate you. Kindest regards, Dinesha Twiggs

## 2024-10-31 NOTE — Patient Instructions (Signed)
 Be Involved in Caring For Your Health:  Taking Medications When medications are taken as directed, they can greatly improve your health. But if they are not taken as prescribed, they may not work. In some cases, not taking them correctly can be harmful. To help ensure your treatment remains effective and safe, understand your medications and how to take them. Bring your medications to each visit for review by your provider.  Your lab results, notes, and after visit summary will be available on My Chart. We strongly encourage you to use this feature. If lab results are abnormal the clinic will contact you with the appropriate steps. If the clinic does not contact you assume the results are satisfactory. You can always view your results on My Chart. If you have questions regarding your health or results, please contact the clinic during office hours. You can also ask questions on My Chart.  We at [facility] are grateful that you chose us  to provide your care. We strive to provide evidence-based and compassionate care and are always looking for feedback. If you get a survey from the clinic please complete this so we can hear your opinions.   Pap Test Using Cells From the Cervix Why am I having this test? A Pap test, also called a Pap smear, is a screening test to check for signs of: Infection. Irritation and swelling. Cervical cancer. The cervix is the lowest part of the uterus. Precancerous changes. These are changes that may be a sign that cancer is developing. Females need this test regularly. In general, you should start testing at age 7. You should then have a Pap test every 3 years until you're 38 years old. From age 62 to 16, you may choose either of these: Having a Pap test done at the same time as a human papillomavirus (HPV) test every 5 years. Keep having Pap tests done every 3 years. After age 60, you can stop testing if: You've had 3 normal Pap tests in a row or 2  normal Pap and HPV test results in a row. And you have no history of abnormal cervical cells or cervical cancer. Your health care provider may recommend Pap tests more or less often depending on your health and past Pap test results. What is being tested? Cells taken from the cervix are checked for signs of infection, irritation and swelling, or cell changes. What kind of sample is taken?  Your provider will collect some cells from the surface of your cervix. This is done using a small cotton swab, plastic spatula, or brush that's put into your vagina. To do the test, your vagina is held open using a tool called a speculum. This sample is often taken during a pelvic exam. You'll be lying on your back on an exam table with your feet in footrests called stirrups. In some cases, fluids (secretions) from the cervix or vagina may also be collected. How do I prepare for this test? Know where you are in your menstrual cycle. If you have your period on the day of the test, you may need to reschedule. You may need to reschedule if you have a known vaginal infection on the day of the test. Follow instructions about changing or stopping your regular medicines. Some medicines, such as vaginal medicines and tetracycline, can affect test results. Do not douche 2-3 days before or on the day of the test. Tell a health care provider about: Any allergies you have. All medicines you take. These  include vitamins, herbs, eye drops, and creams. Any bleeding problems you have. Any surgeries you've had. Any medical problems you have. Whether you're pregnant or may be pregnant. How are the results reported? Your test results will be reported as either abnormal or normal. What do the results mean? A normal test result means that you don't have signs of cancer of the cervix or other cell changes. An abnormal result may mean that you have: Cancer. A Pap test by itself can't diagnose cancer. You'll need more tests done  if cancer is suspected. Precancerous changes in your cervix. Irritation and swelling of the cervix. A sexually transmitted infection (STI). A fungal infection. An infection from a parasite. Talk with your provider about what your results mean. More tests may be needed. Questions to ask your health care provider Ask your provider, or the department that's doing the test: When will my results be ready? How will I get my results? What are my treatment options? What other tests do I need? What are my next steps? This information is not intended to replace advice given to you by your health care provider. Make sure you discuss any questions you have with your health care provider. Document Revised: 05/29/2024 Document Reviewed: 12/07/2023 Elsevier Patient Education  2025 [employer].

## 2024-11-06 ENCOUNTER — Ambulatory Visit: Admitting: Nurse Practitioner

## 2024-11-06 ENCOUNTER — Encounter: Payer: Self-pay | Admitting: Nurse Practitioner

## 2024-11-06 VITALS — BP 111/69 | HR 70 | Temp 98.7°F | Ht 65.0 in | Wt 174.4 lb

## 2024-11-06 DIAGNOSIS — R87810 Cervical high risk human papillomavirus (HPV) DNA test positive: Secondary | ICD-10-CM

## 2024-11-06 DIAGNOSIS — R87610 Atypical squamous cells of undetermined significance on cytologic smear of cervix (ASC-US): Secondary | ICD-10-CM

## 2024-11-06 DIAGNOSIS — N926 Irregular menstruation, unspecified: Secondary | ICD-10-CM | POA: Insufficient documentation

## 2024-11-06 DIAGNOSIS — F411 Generalized anxiety disorder: Secondary | ICD-10-CM

## 2024-11-06 MED ORDER — ESCITALOPRAM OXALATE 10 MG PO TABS: 15.0000 mg | ORAL_TABLET | Freq: Every day | ORAL | 2 refills | Status: AC

## 2024-11-06 NOTE — Assessment & Plan Note (Signed)
 History of abnormal. Due for repeat pap today. Obtained and if abnormal will get her back into GYN.

## 2024-11-06 NOTE — Assessment & Plan Note (Signed)
 Chronic, stable at present.  Continue current medication regimen and adjust as needed.  Denies SI/HI. Refills sent in.

## 2024-11-06 NOTE — Progress Notes (Signed)
 "  BP 111/69   Pulse 70   Temp 98.7 F (37.1 C) (Oral)   Ht 5' 5 (1.651 m)   Wt 174 lb 6.4 oz (79.1 kg)   LMP 09/14/2024 (Exact Date)   SpO2 98%   BMI 29.02 kg/m    Subjective:    Patient ID: Kayla Townsend, female    DOB: 1986/01/07, 39 y.o.   MRN: [id number]  HPI: Kayla Townsend is a 39 y.o. female  Chief Complaint  Patient presents with   Gynecologic Exam   Anxiety   Menstrual Problem    Patient states that her last cycle lasted for one month. Wants to see what providers thoughts are on this.    ABNORMAL MENSTRUAL PERIODS She had a menstrual cycle recently last from December 15th to January 16th, light to heavy and then light again. Had this happen once before when stopped birth control. Continues to breast feed, mainly in the evening. Currently taking Meleya .  [id number] Average interval between menses: 35-41 days Length of menses: 1 week Flow: moderate Dysmenorrhea: no Intermenstrual bleeding:no Postcoital bleeding: no Contraception: as above Menarche at age: 39 years old Sexual activity: In a Monogamous Relationship History of sexually transmitted diseases: no History GYN procedures: yes history of LEEP Abnormal pap smears: yes   Dyspareunia: no Vaginal discharge:no Abdominal pain: no Galactorrhea: no Hirsuitism: no Frequent bruising/mucosal bleeding: no Double vision:no Hot flashes: no   ANXIETY/STRESS Was following with [facility]. They started her on Lexapro  and she would like PCP to takeover this. Duration:stable Anxious mood: no  Excessive worrying: no Irritability: no  Sweating: no Nausea: no Palpitations:no Hyperventilation: no Panic attacks: no Agoraphobia: no  Obscessions/compulsions: no Depressed mood: occasional down feeling    11-25-2024    9:49 AM 06/08/2024    1:37 PM 02/10/2024    2:32 PM 01/09/2024   11:29 AM 06/07/2023    9:40 AM  Depression screen PHQ 2/9  Decreased Interest 0 0 0 0 0  Down, Depressed, Hopeless 1 1  0 0 1  PHQ - 2 Score 1 1 0 0 1  Altered sleeping 0 0  0 1  Tired, decreased energy 0 0  0 1  Change in appetite 0 0  0 0  Feeling bad or failure about yourself  0 0  0 0  Trouble concentrating 0 0  0 0  Moving slowly or fidgety/restless 0 0  0 0  Suicidal thoughts 0 0  0 0  PHQ-9 Score 1 1   0  3   Difficult doing work/chores Not difficult at all Not difficult at all  Not difficult at all Not difficult at all     Data saved with a previous flowsheet row definition  Anhedonia: no Weight changes: no Insomnia: none Hypersomnia: no Fatigue/loss of energy: no Feelings of worthlessness: no Feelings of guilt: no Impaired concentration/indecisiveness: no Suicidal ideations: no  Crying spells: no Recent Stressors/Life Changes: no   Relationship problems: no   Family stress: no     Financial stress: no    Job stress: no    Recent death/loss: no     2024/11/25    9:50 AM 06/08/2024    1:37 PM 02/10/2024    2:32 PM 01/09/2024   11:30 AM  GAD 7 : Generalized Anxiety Score  Nervous, Anxious, on Edge 0 0  0  0   Control/stop worrying 0 0  0  0   Worry too much - different things 0 0  0   Trouble relaxing 0 0   0   Restless 0 0   0   Easily annoyed or irritable 1 1   0   Afraid - awful might happen 0 0   0   Total GAD 7 Score 1 1  0  Anxiety Difficulty Not difficult at all Not difficult at all  Not difficult at all     Data saved with a previous flowsheet row definition   Relevant past medical, surgical, family and social history reviewed and updated as indicated. Interim medical history since our last visit reviewed. Allergies and medications reviewed and updated.  Review of Systems  Constitutional:  Negative for activity change, appetite change, diaphoresis, fatigue and fever.  Respiratory:  Negative for cough, chest tightness and shortness of breath.   Cardiovascular:  Negative for chest pain, palpitations and leg swelling.  Gastrointestinal: Negative.   Neurological: Negative.    Psychiatric/Behavioral:  Negative for decreased concentration, self-injury, sleep disturbance and suicidal ideas. The patient is not nervous/anxious.     Per HPI unless specifically indicated above     Objective:    BP 111/69   Pulse 70   Temp 98.7 F (37.1 C) (Oral)   Ht 5' 5 (1.651 m)   Wt 174 lb 6.4 oz (79.1 kg)   LMP 09/14/2024 (Exact Date)   SpO2 98%   BMI 29.02 kg/m   Wt Readings from Last 3 Encounters:  11/06/24 174 lb 6.4 oz (79.1 kg)  06/08/24 169 lb 14.4 oz (77.1 kg)  01/31/24 175 lb (79.4 kg)    Physical Exam Vitals and nursing note reviewed. Exam conducted with a chaperone present.  Constitutional:      General: She is awake. She is not in acute distress.    Appearance: She is well-developed and well-groomed. She is not ill-appearing or toxic-appearing.  HENT:     Head: Normocephalic.     Right Ear: Hearing and external ear normal.     Left Ear: Hearing and external ear normal.  Eyes:     General: Lids are normal.        Right eye: No discharge.        Left eye: No discharge.     Conjunctiva/sclera: Conjunctivae normal.     Pupils: Pupils are equal, round, and reactive to light.  Neck:     Thyroid : No thyromegaly.     Vascular: No carotid bruit.  Cardiovascular:     Rate and Rhythm: Normal rate and regular rhythm.     Heart sounds: Normal heart sounds. No murmur heard.    No gallop.  Pulmonary:     Effort: Pulmonary effort is normal. No accessory muscle usage or respiratory distress.     Breath sounds: Normal breath sounds. No decreased breath sounds, wheezing or rales.  Abdominal:     General: Bowel sounds are normal. There is no distension.     Palpations: Abdomen is soft.     Tenderness: There is no abdominal tenderness.     Hernia: There is no hernia in the left inguinal area or right inguinal area.  Genitourinary:    Exam position: Lithotomy position.     Pubic Area: No rash.      Labia:        Right: No rash.        Left: No rash.       Urethra: No prolapse.     Vagina: Normal.     Cervix: Normal.  Uterus: Normal.      Adnexa: Right adnexa normal and left adnexa normal.     Comments: Cervix slightly posterior. Pap obtained and sent to lab. Musculoskeletal:     Cervical back: Normal range of motion and neck supple.     Right lower leg: No edema.     Left lower leg: No edema.  Lymphadenopathy:     Cervical: No cervical adenopathy.  Skin:    General: Skin is warm and dry.  Neurological:     Mental Status: She is alert and oriented to person, place, and time.     Deep Tendon Reflexes: Reflexes are normal and symmetric.     Reflex Scores:      Brachioradialis reflexes are 2+ on the right side and 2+ on the left side.      Patellar reflexes are 2+ on the right side and 2+ on the left side. Psychiatric:        Attention and Perception: Attention normal.        Mood and Affect: Mood normal.        Speech: Speech normal.        Behavior: Behavior normal. Behavior is cooperative.        Thought Content: Thought content normal.    Results for orders placed or performed in visit on 06/08/24  CBC with Differential/Platelet   Collection Time: 06/08/24  1:36 PM  Result Value Ref Range   WBC 7.5 3.4 - 10.8 x10E3/uL   RBC 4.48 3.77 - 5.28 x10E6/uL   Hemoglobin 14.4 11.1 - 15.9 g/dL   Hematocrit 57.4 65.9 - 46.6 %   MCV 95 79 - 97 fL   MCH 32.1 26.6 - 33.0 pg   MCHC 33.9 31.5 - 35.7 g/dL   RDW 87.4 88.2 - 84.5 %   Platelets 237 150 - 450 x10E3/uL   Neutrophils 60 Not Estab. %   Lymphs 29 Not Estab. %   Monocytes 8 Not Estab. %   Eos 2 Not Estab. %   Basos 1 Not Estab. %   Neutrophils Absolute 4.6 1.4 - 7.0 x10E3/uL   Lymphocytes Absolute 2.2 0.7 - 3.1 x10E3/uL   Monocytes Absolute 0.6 0.1 - 0.9 x10E3/uL   EOS (ABSOLUTE) 0.2 0.0 - 0.4 x10E3/uL   Basophils Absolute 0.0 0.0 - 0.2 x10E3/uL   Immature Granulocytes 0 Not Estab. %   Immature Grans (Abs) 0.0 0.0 - 0.1 x10E3/uL  Comprehensive metabolic panel with GFR    Collection Time: 06/08/24  1:36 PM  Result Value Ref Range   Glucose 104 (H) 70 - 99 mg/dL   BUN 15 6 - 20 mg/dL   Creatinine, Ser 9.28 0.57 - 1.00 mg/dL   eGFR 886 >40 fO/fpw/8.26   BUN/Creatinine Ratio 21 9 - 23   Sodium 141 134 - 144 mmol/L   Potassium 3.6 3.5 - 5.2 mmol/L   Chloride 103 96 - 106 mmol/L   CO2 24 20 - 29 mmol/L   Calcium  9.2 8.7 - 10.2 mg/dL   Total Protein 7.1 6.0 - 8.5 g/dL   Albumin 4.3 3.9 - 4.9 g/dL   Globulin, Total 2.8 1.5 - 4.5 g/dL   Bilirubin Total 0.4 0.0 - 1.2 mg/dL   Alkaline Phosphatase 93 44 - 121 IU/L   AST 21 0 - 40 IU/L   ALT 15 0 - 32 IU/L  Lipid Panel w/o Chol/HDL Ratio   Collection Time: 06/08/24  1:36 PM  Result Value Ref Range   Cholesterol, Total 159  100 - 199 mg/dL   Triglycerides 74 0 - 149 mg/dL   HDL 69 >60 mg/dL   VLDL Cholesterol Cal 14 5 - 40 mg/dL   LDL Chol Calc (NIH) 76 0 - 99 mg/dL  TSH   Collection Time: 06/08/24  1:36 PM  Result Value Ref Range   TSH 0.860 0.450 - 4.500 uIU/mL      Assessment & Plan:   Problem List Items Addressed This Visit       Other   Generalized anxiety disorder - Primary   Chronic, stable at present.  Continue current medication regimen and adjust as needed.  Denies SI/HI. Refills sent in.      Relevant Medications   escitalopram  (LEXAPRO ) 10 MG tablet   ASCUS with positive high risk HPV cervical   History of abnormal. Due for repeat pap today. Obtained and if abnormal will get her back into GYN.      Relevant Orders   Cytology - PAP   Abnormal menstrual cycle   Recent cycle lasted one month, cycle this month has not started. She is on BCP and is still breast feeding. Discussed with her that at times with hormone shifts cycles can change, but if this becomes consistent we would want to further assess or get her back into GYN. Recent labs reassuring.        Follow up plan: Return in about 7 months (around 06/10/2025) for Annual Physical after 06/08/25.      "

## 2024-11-06 NOTE — Assessment & Plan Note (Signed)
 Recent cycle lasted one month, cycle this month has not started. She is on BCP and is still breast feeding. Discussed with her that at times with hormone shifts cycles can change, but if this becomes consistent we would want to further assess or get her back into GYN. Recent labs reassuring.

## 2025-06-11 ENCOUNTER — Encounter: Admitting: Nurse Practitioner
